# Patient Record
Sex: Male | Born: 1950 | Race: White | Hispanic: No | Smoking: Former smoker
Health system: Southern US, Community
[De-identification: ages and names within clinical notes are randomized; demographics above are authoritative.]

## PROBLEM LIST (undated history)

## (undated) DIAGNOSIS — I639 Cerebral infarction, unspecified: Secondary | ICD-10-CM

## (undated) DIAGNOSIS — R350 Frequency of micturition: Secondary | ICD-10-CM

## (undated) DIAGNOSIS — G629 Polyneuropathy, unspecified: Secondary | ICD-10-CM

## (undated) DIAGNOSIS — L0291 Cutaneous abscess, unspecified: Secondary | ICD-10-CM

## (undated) DIAGNOSIS — L97309 Non-pressure chronic ulcer of unspecified ankle with unspecified severity: Secondary | ICD-10-CM

## (undated) DIAGNOSIS — M549 Dorsalgia, unspecified: Secondary | ICD-10-CM

## (undated) DIAGNOSIS — L0390 Cellulitis, unspecified: Secondary | ICD-10-CM

## (undated) DIAGNOSIS — F329 Major depressive disorder, single episode, unspecified: Secondary | ICD-10-CM

## (undated) DIAGNOSIS — F3289 Other specified depressive episodes: Secondary | ICD-10-CM

## (undated) DIAGNOSIS — I10 Essential (primary) hypertension: Secondary | ICD-10-CM

## (undated) DIAGNOSIS — D126 Benign neoplasm of colon, unspecified: Secondary | ICD-10-CM

## (undated) DIAGNOSIS — T25319A Burn of third degree of unspecified ankle, initial encounter: Secondary | ICD-10-CM

## (undated) DIAGNOSIS — J309 Allergic rhinitis, unspecified: Secondary | ICD-10-CM

## (undated) HISTORY — DX: Non-pressure chronic ulcer of unspecified ankle with unspecified severity: L97.309

## (undated) HISTORY — PX: EYE SURGERY: SHX253

## (undated) HISTORY — DX: Allergic rhinitis, unspecified: J30.9

## (undated) HISTORY — DX: Major depressive disorder, single episode, unspecified: F32.9

## (undated) HISTORY — DX: Other specified depressive episodes: F32.89

## (undated) HISTORY — DX: Burn of third degree of unspecified ankle, initial encounter: T25.319A

## (undated) HISTORY — DX: Cellulitis, unspecified: L03.90

## (undated) HISTORY — DX: Frequency of micturition: R35.0

## (undated) HISTORY — DX: Polyneuropathy, unspecified: G62.9

## (undated) HISTORY — DX: Cutaneous abscess, unspecified: L02.91

## (undated) HISTORY — DX: Dorsalgia, unspecified: M54.9

## (undated) HISTORY — PX: TONSILLECTOMY: SUR1361

## (undated) HISTORY — DX: Benign neoplasm of colon, unspecified: D12.6

---

## 2010-12-08 LAB — IFOBT (OCCULT BLOOD): IFOBT: NEGATIVE

## 2010-12-27 ENCOUNTER — Telehealth: Payer: Self-pay

## 2010-12-28 ENCOUNTER — Other Ambulatory Visit: Payer: Self-pay

## 2010-12-28 DIAGNOSIS — Z139 Encounter for screening, unspecified: Secondary | ICD-10-CM

## 2010-12-28 NOTE — Telephone Encounter (Signed)
Gastroenterology Pre-Procedure Form  Request Date: 12/26/2010       Requesting Physician: Dr. Julaine Fusi     PATIENT INFORMATION:  Rick Welch is a 60 y.o., male (DOB=Jul 06, 1950).  PROCEDURE: Procedure(s) requested: colonoscopy Procedure Reason: screening for colon cancer  PATIENT REVIEW QUESTIONS: The patient reports the following:   1. Diabetes Melitis: Yes 2. Joint replacements in the past 12 months: no 3. Major health problems in the past 3 months: no 4. Has an artificial valve or MVP:no 5. Has been advised in past to take antibiotics in advance of a procedure like teeth cleaning: no}    MEDICATIONS & ALLERGIES:    Patient reports the following regarding taking any blood thinners:   Plavix? no Aspirin?no Coumadin?  no  Patient confirms/reports the following medications:  Current Outpatient Prescriptions  Medication Sig Dispense Refill  . glyBURIDE (DIABETA) 5 MG tablet Take 5 mg by mouth daily with breakfast.        . lisinopril (PRINIVIL,ZESTRIL) 40 MG tablet Take 40 mg by mouth daily.        . metFORMIN (GLUCOPHAGE) 1000 MG tablet Take 1,000 mg by mouth 2 (two) times daily.        . Multiple Vitamin (MULTIVITAMIN) tablet Take 1 tablet by mouth daily.        . NON FORMULARY Bisoprolol Fumarate HCT   5 mg per pt/ one tablet daily         Patient confirms/reports the following allergies:  Allergies  Allergen Reactions  . Iodine Other (See Comments)    THROAT SWELLING  . Penicillins Other (See Comments)    UNSURE/ WHEN PT WAS A CHILD    Patient is appropriate to schedule for requested procedure(s): yes  AUTHORIZATION INFORMATION Primary Insurance:   ID #:  Group #:  Pre-Cert / Auth required:  Pre-Cert / Auth #:   Secondary Insurance:   ID #:   Group #:  Pre-Cert / Auth required Pre-Cert / Auth #  No orders of the defined types were placed in this encounter.    SCHEDULE INFORMATION: Procedure has been scheduled as follows:  Date: 02/09/2011     8:30  AM Location: [facility] Short Stay  This Gastroenterology Pre-Precedure Form is being routed to the following provider(s) for review: Jonette Eva, MD

## 2011-01-08 NOTE — Telephone Encounter (Signed)
HOLD GLYBURIDE ON AM OF PROCEDURE.  MOVIPREP SPLIT DOSING

## 2011-01-15 NOTE — Telephone Encounter (Signed)
Rx and instructions mailed.  

## 2011-01-24 ENCOUNTER — Telehealth: Payer: Self-pay

## 2011-01-24 NOTE — Telephone Encounter (Signed)
Home number busy. [facility]  [phone] for a return call to update triage.

## 2011-01-29 NOTE — Telephone Encounter (Signed)
LMOM for a return call.  

## 2011-02-06 NOTE — Telephone Encounter (Signed)
LMOM both phone numbers for a return call.

## 2011-02-06 NOTE — Telephone Encounter (Signed)
Pt returned called. Updated info for his procedure. No new problems, just 2 new blood pressure meds added.

## 2011-02-07 NOTE — Telephone Encounter (Signed)
Called, busy signal. (Pt has instructions, just need to inform ok to have breakfast morning of prep)

## 2011-02-07 NOTE — Telephone Encounter (Signed)
HOLD GLYBURIDE ON AM OF PROCEDURE.  MOVI PREP SPLIT DOSING, REGULAR BREAKFAST. CLEAR LIQUIDS AFTER 9 AM.

## 2011-02-08 MED ORDER — SODIUM CHLORIDE 0.45 % IV SOLN
Freq: Once | INTRAVENOUS | Status: AC
  Administered 2011-02-09: 08:00:00 via INTRAVENOUS

## 2011-02-09 ENCOUNTER — Other Ambulatory Visit: Payer: Self-pay | Admitting: Gastroenterology

## 2011-02-09 ENCOUNTER — Encounter (HOSPITAL_COMMUNITY): Payer: Self-pay | Admitting: *Deleted

## 2011-02-09 ENCOUNTER — Encounter (HOSPITAL_COMMUNITY)
Admission: RE | Disposition: A | Discharge status: Home / Self Care | Payer: Self-pay | Source: Ambulatory Visit | Attending: Gastroenterology

## 2011-02-09 ENCOUNTER — Ambulatory Visit (HOSPITAL_COMMUNITY)
Admission: RE | Admit: 2011-02-09 | Discharge: 2011-02-09 | Disposition: A | Discharge status: Home / Self Care | Payer: 59 | Source: Ambulatory Visit | Attending: Gastroenterology | Admitting: Gastroenterology

## 2011-02-09 DIAGNOSIS — I10 Essential (primary) hypertension: Secondary | ICD-10-CM | POA: Insufficient documentation

## 2011-02-09 DIAGNOSIS — D126 Benign neoplasm of colon, unspecified: Secondary | ICD-10-CM

## 2011-02-09 DIAGNOSIS — Z01812 Encounter for preprocedural laboratory examination: Secondary | ICD-10-CM | POA: Insufficient documentation

## 2011-02-09 DIAGNOSIS — E119 Type 2 diabetes mellitus without complications: Secondary | ICD-10-CM | POA: Insufficient documentation

## 2011-02-09 DIAGNOSIS — Z139 Encounter for screening, unspecified: Secondary | ICD-10-CM

## 2011-02-09 DIAGNOSIS — Z1211 Encounter for screening for malignant neoplasm of colon: Secondary | ICD-10-CM | POA: Insufficient documentation

## 2011-02-09 DIAGNOSIS — Z79899 Other long term (current) drug therapy: Secondary | ICD-10-CM | POA: Insufficient documentation

## 2011-02-09 DIAGNOSIS — K648 Other hemorrhoids: Secondary | ICD-10-CM | POA: Insufficient documentation

## 2011-02-09 HISTORY — DX: Essential (primary) hypertension: I10

## 2011-02-09 HISTORY — PX: COLONOSCOPY: SHX5424

## 2011-02-09 LAB — GLUCOSE, CAPILLARY: Glucose-Capillary: 167 mg/dL — ABNORMAL HIGH (ref 70–99)

## 2011-02-09 SURGERY — COLONOSCOPY
Anesthesia: Moderate Sedation

## 2011-02-09 MED ORDER — STERILE WATER FOR IRRIGATION IR SOLN
Status: DC | PRN
  Administered 2011-02-09: 08:00:00

## 2011-02-09 MED ORDER — MIDAZOLAM HCL 5 MG/5ML IJ SOLN
INTRAMUSCULAR | Status: AC
  Filled 2011-02-09: qty 10

## 2011-02-09 MED ORDER — MEPERIDINE HCL 100 MG/ML IJ SOLN
INTRAMUSCULAR | Status: DC | PRN
  Administered 2011-02-09: 50 mg via INTRAVENOUS
  Administered 2011-02-09: 25 mg via INTRAVENOUS

## 2011-02-09 MED ORDER — MIDAZOLAM HCL 5 MG/5ML IJ SOLN
INTRAMUSCULAR | Status: DC | PRN
  Administered 2011-02-09: 1 mg via INTRAVENOUS
  Administered 2011-02-09: 2 mg via INTRAVENOUS

## 2011-02-09 MED ORDER — MEPERIDINE HCL 100 MG/ML IJ SOLN
INTRAMUSCULAR | Status: AC
  Filled 2011-02-09: qty 2

## 2011-02-09 MED ORDER — EPINEPHRINE HCL 0.1 MG/ML IJ SOLN
INTRAMUSCULAR | Status: AC
  Filled 2011-02-09: qty 20

## 2011-02-09 NOTE — H&P (Signed)
  Primary Care Physician:  Henri Medal, MD Primary Gastroenterologist:  Dr. Darrick Penna  Pre-Procedure History & Physical: HPI:  Rick Welch is a 60 y.o. male here for COLON CANCER SCREENING.  Past Medical History  Diagnosis Date  . Diabetes mellitus   . Hypertension   . Asthma     Past Surgical History  Procedure Date  . Tonsillectomy     Prior to Admission medications   Medication Sig Start Date End Date Taking? Authorizing Provider  amLODipine (NORVASC) 10 MG tablet Take 10 mg by mouth daily.     Yes Historical Provider, MD  bisoprolol-hydrochlorothiazide (ZIAC) 5-6.25 MG per tablet Take 1 tablet by mouth daily.     Yes Historical Provider, MD  glyBURIDE (DIABETA) 5 MG tablet Take 5 mg by mouth daily with breakfast.     Yes Historical Provider, MD  lisinopril (PRINIVIL,ZESTRIL) 40 MG tablet Take 40 mg by mouth daily.     Yes Historical Provider, MD  metFORMIN (GLUCOPHAGE) 1000 MG tablet Take 1,000 mg by mouth 2 (two) times daily.     Yes Historical Provider, MD  Multiple Vitamin (MULTIVITAMIN) tablet Take 1 tablet by mouth daily.     Yes Historical Provider, MD  NON FORMULARY Bisoprolol Fumarate HCT   5 mg per pt/ one tablet daily     Historical Provider, MD    Allergies as of 12/28/2010 - Review Complete 12/28/2010  Allergen Reaction Noted  . Iodine Other (See Comments) 12/28/2010  . Penicillins Other (See Comments) 12/28/2010    Family History  Problem Relation Age of Onset  . Colon cancer Neg Hx   NO POLYPS  History   Social History  . Marital Status: Divorced    Spouse Name: N/A    Number of Children: N/A  . Years of Education: N/A   Occupational History  . Not on file.   Social History Main Topics  . Smoking status: Current Everyday Smoker -- 0.2 packs/day for 20 years  . Smokeless tobacco: Not on file  . Alcohol Use: Not on file  . Drug Use: No  . Sexually Active:    Other Topics Concern  . Not on file   Social History Narrative  . No  narrative on file    Review of Systems: See HPI, otherwise negative ROS   Physical Exam: BP 160/82  Pulse 71  Temp(Src) 98.2 F (36.8 C) (Oral)  Resp 20  Ht 5\' 9"  (1.753 m)  Wt 210 lb (95.255 kg)  BMI 31.01 kg/m2  SpO2 95% General:   Alert,  pleasant and cooperative in NAD Head:  Normocephalic and atraumatic. Neck:  Supple Lungs:  Clear throughout to auscultation.    Heart:  Regular rate and rhythm. Abdomen:  Soft, nontender and nondistended. Normal bowel sounds, without guarding, and without rebound.   Neurologic:  Alert and  oriented x4;  grossly normal neurologically.  Impression/Plan:    Average risk  PLAN:  TCS TODAY

## 2011-02-09 NOTE — Progress Notes (Signed)
Pt's IV has already been charted as taken out at 0953 by someone else. I was the nurse that actually removed the 22g in the right lateral wrist before pt's discharge home after colonoscopy at 1000, pt's IV tip was intact upon removing.

## 2011-02-16 ENCOUNTER — Telehealth: Payer: Self-pay | Admitting: Gastroenterology

## 2011-02-16 NOTE — Telephone Encounter (Signed)
LMOM to call back

## 2011-02-16 NOTE — Telephone Encounter (Signed)
Please call pt. She had AN ADVANCED adenomas removed from her colon. TCS in 3 years. High fiber diet. ALL FIRST DEGREE RELATIVES NEED A TCS AT AGE 62 AND THEN Q5 YEARS.

## 2011-02-16 NOTE — Telephone Encounter (Signed)
Results Cc to PCP  

## 2011-02-19 ENCOUNTER — Encounter (HOSPITAL_COMMUNITY): Payer: Self-pay | Admitting: Gastroenterology

## 2011-02-19 NOTE — Telephone Encounter (Signed)
Pt called back and is aware of results with now question.

## 2011-02-22 NOTE — Telephone Encounter (Signed)
tcs in 3 years

## 2011-09-19 DIAGNOSIS — L97309 Non-pressure chronic ulcer of unspecified ankle with unspecified severity: Secondary | ICD-10-CM

## 2011-09-19 HISTORY — DX: Non-pressure chronic ulcer of unspecified ankle with unspecified severity: L97.309

## 2011-09-24 ENCOUNTER — Encounter: Payer: Self-pay | Admitting: Vascular Surgery

## 2011-09-25 ENCOUNTER — Encounter: Payer: Self-pay | Admitting: Vascular Surgery

## 2011-09-25 ENCOUNTER — Ambulatory Visit (INDEPENDENT_AMBULATORY_CARE_PROVIDER_SITE_OTHER): Payer: 59 | Admitting: Vascular Surgery

## 2011-09-25 VITALS — BP 149/77 | HR 71 | Temp 98.8°F | Ht 69.0 in | Wt 203.0 lb

## 2011-09-25 DIAGNOSIS — I739 Peripheral vascular disease, unspecified: Secondary | ICD-10-CM

## 2011-09-25 DIAGNOSIS — L98499 Non-pressure chronic ulcer of skin of other sites with unspecified severity: Secondary | ICD-10-CM | POA: Insufficient documentation

## 2011-09-25 DIAGNOSIS — I7025 Atherosclerosis of native arteries of other extremities with ulceration: Secondary | ICD-10-CM | POA: Insufficient documentation
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
## 2013-02-02 ENCOUNTER — Ambulatory Visit: Payer: 59 | Admitting: Cardiology

## 2013-02-02 ENCOUNTER — Ambulatory Visit (INDEPENDENT_AMBULATORY_CARE_PROVIDER_SITE_OTHER): Payer: 59 | Admitting: Cardiology

## 2013-02-02 ENCOUNTER — Encounter: Payer: Self-pay | Admitting: Cardiology

## 2013-02-02 VITALS — BP 153/82 | HR 59 | Ht 69.0 in | Wt 211.0 lb

## 2013-02-02 DIAGNOSIS — R609 Edema, unspecified: Secondary | ICD-10-CM

## 2013-02-02 DIAGNOSIS — R0609 Other forms of dyspnea: Secondary | ICD-10-CM

## 2013-02-02 DIAGNOSIS — E119 Type 2 diabetes mellitus without complications: Secondary | ICD-10-CM | POA: Insufficient documentation

## 2013-02-02 DIAGNOSIS — R0600 Dyspnea, unspecified: Secondary | ICD-10-CM

## 2013-02-02 DIAGNOSIS — I10 Essential (primary) hypertension: Secondary | ICD-10-CM | POA: Insufficient documentation

## 2013-02-02 DIAGNOSIS — R600 Localized edema: Secondary | ICD-10-CM

## 2013-02-02 MED ORDER — HYDROCHLOROTHIAZIDE 25 MG PO TABS: 25.0000 mg | ORAL_TABLET | Freq: Every day | ORAL | Status: DC

## 2013-02-02 NOTE — Patient Instructions (Addendum)
Your physician recommends that you schedule a follow-up appointment in: 3-4 weeks with Dr. Wyline Mood. This appointment will be scheduled today before you leave.  Your physician has recommended you make the following change in your medication:  Stop: Bisoprolol - Hydrochlorothiazide Start: Hydrochlorothiazide 25 MG once daily  Continue all other medications the same.   Your physician has requested that you have an echocardiogram. Echocardiography is a painless test that uses sound waves to create images of your heart. It provides your doctor with information about the size and shape of your heart and how well your heart's chambers and valves are working. This procedure takes approximately one hour. There are no restrictions for this procedure.
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
## 2013-02-19 ENCOUNTER — Other Ambulatory Visit (INDEPENDENT_AMBULATORY_CARE_PROVIDER_SITE_OTHER): Payer: 59

## 2013-02-19 ENCOUNTER — Other Ambulatory Visit: Payer: Self-pay

## 2013-02-19 DIAGNOSIS — R0600 Dyspnea, unspecified: Secondary | ICD-10-CM

## 2013-02-19 DIAGNOSIS — R609 Edema, unspecified: Secondary | ICD-10-CM

## 2013-02-19 DIAGNOSIS — R0609 Other forms of dyspnea: Secondary | ICD-10-CM

## 2013-02-24 ENCOUNTER — Telehealth: Payer: Self-pay | Admitting: Cardiology

## 2013-02-24 ENCOUNTER — Ambulatory Visit (INDEPENDENT_AMBULATORY_CARE_PROVIDER_SITE_OTHER): Payer: 59 | Admitting: Cardiology

## 2013-02-24 ENCOUNTER — Encounter: Payer: Self-pay | Admitting: Cardiology

## 2013-02-24 VITALS — BP 160/89 | HR 73 | Ht 69.0 in | Wt 201.0 lb

## 2013-02-24 DIAGNOSIS — R600 Localized edema: Secondary | ICD-10-CM

## 2013-02-24 DIAGNOSIS — R609 Edema, unspecified: Secondary | ICD-10-CM

## 2013-02-24 DIAGNOSIS — I519 Heart disease, unspecified: Secondary | ICD-10-CM

## 2013-02-24 DIAGNOSIS — I10 Essential (primary) hypertension: Secondary | ICD-10-CM

## 2013-02-24 DIAGNOSIS — I5189 Other ill-defined heart diseases: Secondary | ICD-10-CM | POA: Insufficient documentation

## 2013-02-24 NOTE — Patient Instructions (Signed)
Your physician recommends that you schedule a follow-up appointment in: 6 months with Dr. Wyline Mood. You should receive a letter in the mail in 4 months. If you do not receive this letter by April 2015 call our office to schedule this appointment.   Your physician has recommended you make the following change in your medication:  Start: Aspirin 81 MG once daily.  Your physician wants you to keep a blood pressure log.  Your physician wants you to bring your blood pressure cuff with you to your next office visit.
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
## 2013-02-24 NOTE — Telephone Encounter (Signed)
Opened in error

## 2013-02-24 NOTE — Telephone Encounter (Signed)
Message copied by Burnice Logan on Tue Feb 24, 2013  3:39 PM ------      Message from: Dina Rich F      Created: Tue Feb 24, 2013  8:54 AM       Please let patient know that his echo shows normal pumping function. His heart is mildly stiff, which often can happen as we get older or if there is a history of hypertension. We will discuss everything in detail when I see him next.                   Dina Rich ------

## 2013-02-24 NOTE — Telephone Encounter (Signed)
Pt.notified

## 2013-02-24 NOTE — Telephone Encounter (Signed)
Message copied by Burnice Logan on Tue Feb 24, 2013  5:15 PM ------      Message from: Dina Rich F      Created: Tue Feb 24, 2013  8:54 AM       Please let patient know that his echo shows normal pumping function. His heart is mildly stiff, which often can happen as we get older or if there is a history of hypertension. We will discuss everything in detail when I see him next.                   Dina Rich ------

## 2013-02-24 NOTE — Telephone Encounter (Signed)
Called pt and line was busy will try again later.

## 2013-09-08 ENCOUNTER — Encounter: Payer: Self-pay | Admitting: Cardiology

## 2013-09-08 ENCOUNTER — Ambulatory Visit (INDEPENDENT_AMBULATORY_CARE_PROVIDER_SITE_OTHER): Payer: Medicare Other | Admitting: Cardiology

## 2013-09-08 VITALS — BP 148/83 | HR 75 | Ht 69.0 in | Wt 204.0 lb

## 2013-09-08 DIAGNOSIS — R609 Edema, unspecified: Secondary | ICD-10-CM

## 2013-09-08 DIAGNOSIS — I5189 Other ill-defined heart diseases: Secondary | ICD-10-CM

## 2013-09-08 DIAGNOSIS — I10 Essential (primary) hypertension: Secondary | ICD-10-CM

## 2013-09-08 DIAGNOSIS — I519 Heart disease, unspecified: Secondary | ICD-10-CM

## 2013-09-08 DIAGNOSIS — R600 Localized edema: Secondary | ICD-10-CM

## 2013-09-08 NOTE — Patient Instructions (Signed)
Continue all current medications. DASH & low fat diet info provided today Your physician wants you to follow up in:  1 year.  You will receive a reminder letter in the mail one-two months in advance.  If you don't receive a letter, please call our office to schedule the follow up appointment
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
## 2013-12-30 ENCOUNTER — Other Ambulatory Visit: Payer: Self-pay | Admitting: *Deleted

## 2013-12-30 ENCOUNTER — Telehealth: Payer: Self-pay | Admitting: *Deleted

## 2013-12-30 MED ORDER — HYDROCHLOROTHIAZIDE 25 MG PO TABS: 25.0000 mg | ORAL_TABLET | Freq: Every day | ORAL | Status: DC

## 2013-12-30 NOTE — Telephone Encounter (Signed)
Someone else took care of this.

## 2014-01-20 ENCOUNTER — Encounter: Payer: Self-pay | Admitting: Gastroenterology

## 2015-06-23 ENCOUNTER — Encounter: Payer: Self-pay | Admitting: Cardiology

## 2015-06-23 LAB — HM DIABETES EYE EXAM

## 2015-10-31 ENCOUNTER — Encounter: Payer: Self-pay | Admitting: Cardiology

## 2016-12-07 ENCOUNTER — Encounter: Payer: Self-pay | Admitting: *Deleted

## 2016-12-10 ENCOUNTER — Encounter: Payer: Self-pay | Admitting: Cardiology

## 2016-12-10 ENCOUNTER — Ambulatory Visit (INDEPENDENT_AMBULATORY_CARE_PROVIDER_SITE_OTHER): Payer: Medicare Other | Admitting: Cardiology

## 2016-12-10 VITALS — BP 132/72 | HR 69 | Ht 69.0 in | Wt 167.0 lb

## 2016-12-10 DIAGNOSIS — I10 Essential (primary) hypertension: Secondary | ICD-10-CM

## 2016-12-10 DIAGNOSIS — E782 Mixed hyperlipidemia: Secondary | ICD-10-CM | POA: Diagnosis not present

## 2016-12-10 DIAGNOSIS — I5032 Chronic diastolic (congestive) heart failure: Secondary | ICD-10-CM

## 2016-12-10 DIAGNOSIS — I639 Cerebral infarction, unspecified: Secondary | ICD-10-CM | POA: Diagnosis not present

## 2016-12-10 NOTE — Patient Instructions (Signed)
Your physician recommends that you schedule a follow-up appointment in: [place]  Your physician recommends that you continue on your current medications as directed. Please refer to the Current Medication list given to you today.  Your physician recommends that you return for lab work BMP/MG  Your physician has recommended that you wear an event monitor FOR 30 DAYS. Event monitors are medical devices that record the heart's electrical activity. Doctors most often [place] these monitors to diagnose arrhythmias. Arrhythmias are problems with the speed or rhythm of the heartbeat. The monitor is a small, portable device. You can wear one while you do your normal daily activities. This is usually used to diagnose what is causing palpitations/syncope (passing out).  Thank you for choosing [facility]!!
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
## 2016-12-13 ENCOUNTER — Encounter (INDEPENDENT_AMBULATORY_CARE_PROVIDER_SITE_OTHER): Payer: Medicare Other

## 2016-12-13 DIAGNOSIS — I639 Cerebral infarction, unspecified: Secondary | ICD-10-CM

## 2016-12-19 ENCOUNTER — Ambulatory Visit (INDEPENDENT_AMBULATORY_CARE_PROVIDER_SITE_OTHER): Payer: Medicare Other | Admitting: Internal Medicine

## 2016-12-19 ENCOUNTER — Ambulatory Visit (INDEPENDENT_AMBULATORY_CARE_PROVIDER_SITE_OTHER)
Admission: RE | Admit: 2016-12-19 | Discharge: 2016-12-19 | Disposition: A | Discharge status: Home / Self Care | Payer: Medicare Other | Source: Ambulatory Visit | Attending: Internal Medicine | Admitting: Internal Medicine

## 2016-12-19 ENCOUNTER — Institutional Professional Consult (permissible substitution): Payer: BLUE CROSS/BLUE SHIELD | Admitting: Emergency Medicine

## 2016-12-19 ENCOUNTER — Encounter: Payer: Self-pay | Admitting: Internal Medicine

## 2016-12-19 VITALS — BP 130/70 | HR 70 | Ht 69.0 in | Wt 180.0 lb

## 2016-12-19 DIAGNOSIS — R9389 Abnormal findings on diagnostic imaging of other specified body structures: Secondary | ICD-10-CM | POA: Diagnosis not present

## 2016-12-19 DIAGNOSIS — I639 Cerebral infarction, unspecified: Secondary | ICD-10-CM | POA: Diagnosis not present

## 2016-12-19 NOTE — Patient Instructions (Signed)
Please remember to go to the  x-ray department downstairs in the basement  for your tests - we will call you with the results when they are available.      Please schedule a follow up office visit in 4 weeks, sooner if needed with pfts on return

## 2016-12-19 NOTE — Progress Notes (Signed)
Subjective:     Patient ID: Rick Welch, male   DOB: [birth year],    MRN: [id number]  HPI   79 yowm  Quit smokng 2004 and doing ok was living on his own but 3 strokes since April 2018 and after the third in July 2018 at [facility] in [place]  With ? Lung mass referred to pulmonary clinic 12/19/2016 by Dr   Glori Luis   12/19/2016 1st [facility] office visit/ Rick Welch   Chief Complaint  Patient presents with  . pulmonary consult    Pt was referred for spots on his lung, pt is not sure who referred him.   pt is wheelchair bound and denies sob at rest or noct/ no cough but very weak and unsteady on feet since last cva and maint on eliquis  No obvious day to day or daytime variability in symptoms or assoc excess/ purulent sputum or mucus plugs or hemoptysis or cp or chest tightness, subjective wheeze or overt sinus or hb symptoms. No unusual exp hx or h/o childhood pna/ asthma or knowledge of premature birth.  Sleeping ok flat without nocturnal  or early am exacerbation  of respiratory  c/o's or need for noct saba. Also denies any obvious fluctuation of symptoms with weather or environmental changes or other aggravating or alleviating factors except as outlined above   Current Allergies, Complete Past Medical History, Past Surgical History, Family History, and Social History were reviewed in [employer] record.  ROS  The following are not active complaints unless bolded Hoarseness, sore throat, dysphagia, dental problems, itching, sneezing,  nasal congestion or discharge of excess mucus or purulent secretions, ear ache,   fever, chills, sweats, unintended wt loss or wt gain, classically pleuritic or exertional cp,  orthopnea pnd or leg swelling, presyncope, palpitations, abdominal pain, anorexia, nausea, vomiting, diarrhea  or change in bowel habits or change in bladder habits, change in stools or change in urine, dysuria, hematuria,  rash, arthralgias,  visual complaints, headache, numbness, weakness or ataxia or problems with walking or coordination,  change in mood/affect or memory.        Current Meds  Medication Sig  . acetaminophen (TYLENOL) 650 MG CR tablet Take 650 mg by mouth every 8 (eight) hours as needed for pain.  Marland Kitchen apixaban (ELIQUIS) 2.5 MG TABS tablet Take 2.5 mg by mouth 2 (two) times daily.  Marland Kitchen aspirin 81 MG tablet Take 81 mg by mouth daily.  Marland Kitchen atorvastatin (LIPITOR) 80 MG tablet Take 80 mg by mouth daily.  . calcium carbonate (TUMS - DOSED IN MG ELEMENTAL CALCIUM) 500 MG chewable tablet Chew 1 tablet by mouth 2 (two) times daily.  . Cholecalciferol (VITAMIN D3) 1000 units CAPS Take 1 capsule by mouth daily.  . insulin lispro (HUMALOG) 100 UNIT/ML injection Inject into the skin. Sliding scale  . ipratropium-albuterol (DUONEB) 0.5-2.5 (3) MG/3ML SOLN Take 3 mLs by nebulization every 4 (four) hours as needed.  . Magnesium Hydroxide (MILK OF MAGNESIA PO) Take by mouth as needed.  . Multiple Vitamin (MULTIVITAMIN) tablet Take 1 tablet by mouth daily.  . Nutritional Supplements (PROMOD) LIQD Take by mouth daily.  . Nutritional Supplements (RA MELATONIN/B-6 PO) Take 1 tablet by mouth at bedtime.  . polyethylene glycol (MIRALAX / GLYCOLAX) packet Take 17 g by mouth daily.  . potassium chloride (KLOR-CON) 8 MEQ tablet Take 8 mEq by mouth daily.  Marland Kitchen senna (SENOKOT) 8.6 MG tablet Take 2 tablets by mouth 2 (two) times  daily.  . sertraline (ZOLOFT) 100 MG tablet Take 100 mg by mouth daily.  Marland Kitchen torsemide (DEMADEX) 20 MG tablet Take 20 mg by mouth daily.  . vitamin C (ASCORBIC ACID) 500 MG tablet Take 500 mg by mouth daily.          Review of Systems     Objective:   Physical Exam    chronically ill w/c bound extremely weak cough effort  Wt Readings from Last 3 Encounters:  12/19/16 180 lb (81.6 kg)  12/10/16 167 lb (75.8 kg)  09/08/13 204 lb (92.5 kg)    Vital signs reviewed  - Note on arrival 02 sats  97% on RA      HEENT: nl  turbinates bilaterally, and oropharynx. Nl external ear canals without cough reflex - edentulous    NECK :  without JVD/Nodes/TM/ nl carotid upstrokes bilaterally   LUNGS: no acc muscle use,  Nl contour chest which is clear to A and P bilaterally without cough on insp or exp maneuvers   CV:  RRR  no s3 or murmur or increase in P2, and no edema   ABD:  soft and nontender with nl inspiratory excursion . No bruits or organomegaly appreciated, bowel sounds nl  MS:   ext warm without deformities, calf tenderness, cyanosis or clubbing No obvious joint restrictions   SKIN: warm and dry without lesions    NEURO:  alert, confused  "I live at home" with difficult /slow speech /mentation     CT chest w/o contrast 11/06/16  ? rul nodule with vol loss    CXR PA and Lateral:   12/19/2016 :    I personally reviewed images and   impression as follows:   No def mass or vol loss     Assessment:

## 2016-12-20 ENCOUNTER — Encounter: Payer: Self-pay | Admitting: Internal Medicine

## 2016-12-20 NOTE — Progress Notes (Signed)
ATC, NA and no option to leave a msg, WCB 

## 2016-12-20 NOTE — Assessment & Plan Note (Addendum)
[facility] 11/06/16 ? Post obst vol loss RUL/ RUL nodule - CXR 12/19/2016 no mass or vol loss  - Spirometry 12/19/2016  FEV1 1.64 (51%)  Ratio 79    There is no obvious mass or vol loss on today's film and the pt is no shape at present for any form of aggressive rx - appears very debilitated, very  weak cough effort and clearly not a candidate for surgery.   Since there were also other abnormalities seen on CT chest that could represent occult ca rec he undergo PET when he returns in 4 weeks and then consider option of bx of most accessible location though even that is going to require stopping eliquis and may risk recurrent cva/ his main medical condition that has landed him apparently in permanent snf status.   Total time devoted to counseling  > 50 % of initial 60 min office visit:  review case with pt/wife discussion of options/alternatives/ personally creating written customized instructions  in presence of pt  then going over those specific  Instructions directly with the pt including how to use all of the meds but in particular covering each new medication in detail and the difference between the maintenance= "automatic" meds and the prns using an action plan format for the latter (If this problem/symptom => do that organization reading Left to right).  Please see AVS from this visit for a full list of these instructions which I personally wrote for this pt and  are unique to this visit.

## 2016-12-27 ENCOUNTER — Institutional Professional Consult (permissible substitution): Payer: BLUE CROSS/BLUE SHIELD | Admitting: Emergency Medicine

## 2017-01-16 ENCOUNTER — Telehealth: Payer: Self-pay | Admitting: Internal Medicine

## 2017-01-16 NOTE — Telephone Encounter (Signed)
ATC pt, no answer. Left message for pt to call back.  Need to clarify message.

## 2017-01-18 ENCOUNTER — Telehealth: Payer: Self-pay | Admitting: Emergency Medicine

## 2017-01-18 NOTE — Telephone Encounter (Signed)
Malachy Mood called back and she stated that Dr. Dema Severin will be signing these orders.

## 2017-01-18 NOTE — Telephone Encounter (Signed)
RB please advise if you are ok with ordering the speech therapy now that the pt is at home.  Please advise. Thanks

## 2017-01-18 NOTE — Telephone Encounter (Signed)
Called and spoke with Malachy Mood from [facility] and she stated that the pt was discharged from rehab and that he will be coming in today for PT.  She stated that they need to make sure that MW will sign these orders for PT/OT/nursing visits.  MW please advise. Thanks

## 2017-01-21 ENCOUNTER — Telehealth: Payer: Self-pay | Admitting: Internal Medicine

## 2017-01-21 NOTE — Telephone Encounter (Signed)
MW are you ok to give verbal on pt receiving PT 2 times per week for the next 3 weeks. Please advise.

## 2017-01-21 NOTE — Telephone Encounter (Signed)
Fine with me

## 2017-01-22 ENCOUNTER — Telehealth: Payer: Self-pay | Admitting: Internal Medicine

## 2017-01-22 NOTE — Telephone Encounter (Signed)
Spoke with Angie and she states he has not had a bowel movement in 8 days. He is very nauseous and she is requesting something for nausea. He has a poor appetite and she is concerned about 8 days without bowel movement. MW please advise.

## 2017-01-22 NOTE — Telephone Encounter (Signed)
Advised Judy orders were ok'd by RB. Nothing further is needed.

## 2017-01-22 NOTE — Telephone Encounter (Signed)
Advised Angie to call patients PCP. She understood and nothing further is needed.

## 2017-01-22 NOTE — Telephone Encounter (Signed)
This is not related at all to his pulmonary condition and should be addressed by his pcp

## 2017-01-22 NOTE — Telephone Encounter (Signed)
Yes ok to order  

## 2017-01-22 NOTE — Telephone Encounter (Signed)
Spoke with Marzetta Board and gave verbal order for PT. Nothing further is needed.

## 2017-02-05 ENCOUNTER — Ambulatory Visit: Payer: BLUE CROSS/BLUE SHIELD | Admitting: Internal Medicine

## 2017-02-14 ENCOUNTER — Encounter: Payer: Self-pay | Admitting: *Deleted

## 2017-02-14 ENCOUNTER — Telehealth: Payer: Self-pay | Admitting: *Deleted

## 2017-02-14 NOTE — Telephone Encounter (Signed)
Mailbox full - will send normal results through MyChart - routed to pcp

## 2017-02-14 NOTE — Telephone Encounter (Signed)
-----   Message from Arnoldo Lenis, MD sent at 02/07/2017 10:00 AM EST ----- Normal heart monitor  Zandra Abts MD

## 2017-02-15 ENCOUNTER — Encounter (HOSPITAL_COMMUNITY): Payer: Self-pay | Admitting: Emergency Medicine

## 2017-02-15 ENCOUNTER — Emergency Department (HOSPITAL_COMMUNITY): Payer: Medicare Other

## 2017-02-15 ENCOUNTER — Emergency Department (HOSPITAL_COMMUNITY)
Admission: EM | Admit: 2017-02-15 | Discharge: 2017-02-15 | Disposition: A | Discharge status: Home / Self Care | Payer: Medicare Other | Attending: Emergency Medicine | Admitting: Emergency Medicine

## 2017-02-15 DIAGNOSIS — R4701 Aphasia: Secondary | ICD-10-CM | POA: Insufficient documentation

## 2017-02-15 DIAGNOSIS — E1140 Type 2 diabetes mellitus with diabetic neuropathy, unspecified: Secondary | ICD-10-CM | POA: Diagnosis not present

## 2017-02-15 DIAGNOSIS — E860 Dehydration: Secondary | ICD-10-CM

## 2017-02-15 DIAGNOSIS — I10 Essential (primary) hypertension: Secondary | ICD-10-CM | POA: Diagnosis not present

## 2017-02-15 DIAGNOSIS — I110 Hypertensive heart disease with heart failure: Secondary | ICD-10-CM | POA: Insufficient documentation

## 2017-02-15 DIAGNOSIS — Z7901 Long term (current) use of anticoagulants: Secondary | ICD-10-CM | POA: Diagnosis not present

## 2017-02-15 DIAGNOSIS — Z79899 Other long term (current) drug therapy: Secondary | ICD-10-CM | POA: Diagnosis not present

## 2017-02-15 DIAGNOSIS — I69951 Hemiplegia and hemiparesis following unspecified cerebrovascular disease affecting right dominant side: Secondary | ICD-10-CM | POA: Diagnosis not present

## 2017-02-15 DIAGNOSIS — I5030 Unspecified diastolic (congestive) heart failure: Secondary | ICD-10-CM | POA: Insufficient documentation

## 2017-02-15 DIAGNOSIS — Z87891 Personal history of nicotine dependence: Secondary | ICD-10-CM | POA: Diagnosis not present

## 2017-02-15 DIAGNOSIS — E119 Type 2 diabetes mellitus without complications: Secondary | ICD-10-CM | POA: Insufficient documentation

## 2017-02-15 DIAGNOSIS — I69322 Dysarthria following cerebral infarction: Secondary | ICD-10-CM | POA: Insufficient documentation

## 2017-02-15 DIAGNOSIS — J45909 Unspecified asthma, uncomplicated: Secondary | ICD-10-CM | POA: Diagnosis not present

## 2017-02-15 DIAGNOSIS — Z794 Long term (current) use of insulin: Secondary | ICD-10-CM | POA: Insufficient documentation

## 2017-02-15 HISTORY — DX: Cerebral infarction, unspecified: I63.9

## 2017-02-15 LAB — COMPREHENSIVE METABOLIC PANEL
ALK PHOS: 102 U/L (ref 38–126)
ALT: 13 U/L — ABNORMAL LOW (ref 17–63)
ANION GAP: 6 (ref 5–15)
AST: 17 U/L (ref 15–41)
Albumin: 3.5 g/dL (ref 3.5–5.0)
BILIRUBIN TOTAL: 0.4 mg/dL (ref 0.3–1.2)
BUN: 18 mg/dL (ref 6–20)
CALCIUM: 8.7 mg/dL — AB (ref 8.9–10.3)
CO2: 24 mmol/L (ref 22–32)
Chloride: 112 mmol/L — ABNORMAL HIGH (ref 101–111)
Creatinine, Ser: 1.35 mg/dL — ABNORMAL HIGH (ref 0.61–1.24)
GFR calc Af Amer: >60 mL/min (ref >60)
GFR, EST NON AFRICAN AMERICAN: 52 mL/min — AB (ref >60)
GLUCOSE: 117 mg/dL — AB (ref 65–99)
Potassium: 4 mmol/L (ref 3.5–5.1)
Sodium: 142 mmol/L (ref 135–145)
TOTAL PROTEIN: 6.6 g/dL (ref 6.5–8.1)

## 2017-02-15 LAB — URINALYSIS, ROUTINE W REFLEX MICROSCOPIC
Bilirubin Urine: NEGATIVE
Glucose, UA: >=500 mg/dL — AB
Ketones, ur: NEGATIVE mg/dL
Nitrite: NEGATIVE
Protein, ur: >=300 mg/dL — AB
SQUAMOUS EPITHELIAL / LPF: NONE SEEN
Specific Gravity, Urine: 1.016 (ref 1.005–1.030)
pH: 7 (ref 5.0–8.0)

## 2017-02-15 LAB — CBC WITH DIFFERENTIAL/PLATELET
Basophils Absolute: 0 10*3/uL (ref 0.0–0.1)
Basophils Relative: 0 %
Eosinophils Absolute: 0.1 10*3/uL (ref 0.0–0.7)
Eosinophils Relative: 1 %
HEMATOCRIT: 36.4 % — AB (ref 39.0–52.0)
HEMOGLOBIN: 11.5 g/dL — AB (ref 13.0–17.0)
LYMPHS ABS: 1.9 10*3/uL (ref 0.7–4.0)
LYMPHS PCT: 20 %
MCH: 29.2 pg (ref 26.0–34.0)
MCHC: 31.6 g/dL (ref 30.0–36.0)
MCV: 92.4 fL (ref 78.0–100.0)
MONOS PCT: 8 %
Monocytes Absolute: 0.7 10*3/uL (ref 0.1–1.0)
NEUTROS ABS: 6.6 10*3/uL (ref 1.7–7.7)
NEUTROS PCT: 71 %
Platelets: 355 10*3/uL (ref 150–400)
RBC: 3.94 MIL/uL — ABNORMAL LOW (ref 4.22–5.81)
RDW: 15.6 % — ABNORMAL HIGH (ref 11.5–15.5)
WBC: 9.3 10*3/uL (ref 4.0–10.5)

## 2017-02-15 LAB — ETHANOL: Alcohol, Ethyl (B): <10 mg/dL (ref <10)

## 2017-02-15 LAB — CBG MONITORING, ED: Glucose-Capillary: 117 mg/dL — ABNORMAL HIGH (ref 65–99)

## 2017-02-15 MED ORDER — SODIUM CHLORIDE 0.9 % IV BOLUS (SEPSIS)
1000.0000 mL | Freq: Once | INTRAVENOUS | Status: AC
  Administered 2017-02-15: 1000 mL via INTRAVENOUS

## 2017-02-15 NOTE — ED Notes (Signed)
Pt posted for ED discharge.  Pt AVS reviewed with verbalized understandings.  Pt with two male family members to beside requesting EMS transport home.  Transport aranged.  PIV removed with hemostasis obtained.

## 2017-02-15 NOTE — ED Triage Notes (Signed)
Pt reports he had a stroke in April with right sided deficit.  Began having worsening slurred speech this morning upon waking at 1000.  Bruising noted to right side of face.  Pt states he fell in April at rehab causing this.
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
## 2017-02-15 NOTE — Discharge Instructions (Signed)
Please drink plenty of fluids. You work-up today does show mild dehydration.  Return without fail for worsening symptoms, including fever, confusion, changes in speech, or any other symptoms concerning to you.

## 2017-02-15 NOTE — ED Notes (Signed)
Pt had small BM.  Cleaning pt up.  Pt scrotal and perianal area very red in color and raw.  Pt states it is very painful.  Pt states that the home health nurses are not taking care of him and changing him.    States that he uses [facility].  States he has 2 nurses.  He states one does a good job and the other nurse does nothing for him.

## 2017-02-15 NOTE — ED Notes (Signed)
Spoke with DSS and advised on pt's skin on sacral and scrotal area.  ANd pt's comments about the [facility] not taking care of him.

## 2017-02-16 LAB — URINE CULTURE

## 2017-02-26 ENCOUNTER — Telehealth: Payer: Self-pay | Admitting: Internal Medicine

## 2017-02-26 NOTE — Telephone Encounter (Signed)
Spoke with Angie, she states RB sent a order for the catheter but it seems he may have saw pt in the hospital. He has seen MW since so I am not sure who to send this to. Please advise.

## 2017-02-27 ENCOUNTER — Telehealth: Payer: Self-pay | Admitting: Internal Medicine

## 2017-02-27 NOTE — Telephone Encounter (Signed)
A message was sent to RB about this because she said RB placed the order and I didn't think it should have gone to MW. See previous message. We will await response from [place] but do understand this is not a pulmonary issue. FYI RB

## 2017-02-27 NOTE — Telephone Encounter (Signed)
This is not a pulmonary issue and I don't see where we involved in it at any level reviewing th epic notes so needs to be addressed by present pcp or urology if they are following the pt

## 2017-02-27 NOTE — Telephone Encounter (Signed)
Spoke with Angie with [place]  She states needing order to have foley cath removed  She states RB is the one who placed the order but it was "grouped in" with orders by MW also  MW- please advise thanks

## 2017-03-01 NOTE — Telephone Encounter (Signed)
Attempted to contact Angie at [facility]. She didn't answer and her voicemail was full. Will try back.

## 2017-03-01 NOTE — Telephone Encounter (Signed)
I reviewed all the notes.  I do not know why his Foley catheter is ordered under my name because I have never seen the patient.  I cannot determine whether he needs it or does not at this point.  He needs to see his primary physician, review the pros and cons of having the catheter in place with that physician.  And then have the order made to discontinue the catheter by them if appropriate.

## 2017-03-04 NOTE — Telephone Encounter (Signed)
Called and spoke with Angie who stated she called pt's PCP and got the order for the foley catheter to be removed. Nothing further needed.

## 2017-03-15 ENCOUNTER — Emergency Department (HOSPITAL_COMMUNITY): Payer: Medicare Other

## 2017-03-15 ENCOUNTER — Inpatient Hospital Stay (HOSPITAL_COMMUNITY)
Admission: EM | Admit: 2017-03-15 | Discharge: 2017-03-19 | DRG: 391 | Disposition: A | Discharge status: Home Health | Payer: Medicare Other | Attending: Family Medicine | Admitting: Family Medicine

## 2017-03-15 ENCOUNTER — Other Ambulatory Visit: Payer: Self-pay

## 2017-03-15 ENCOUNTER — Encounter (HOSPITAL_COMMUNITY): Payer: Self-pay | Admitting: Emergency Medicine

## 2017-03-15 DIAGNOSIS — Z794 Long term (current) use of insulin: Secondary | ICD-10-CM | POA: Diagnosis not present

## 2017-03-15 DIAGNOSIS — R1310 Dysphagia, unspecified: Secondary | ICD-10-CM | POA: Diagnosis not present

## 2017-03-15 DIAGNOSIS — D62 Acute posthemorrhagic anemia: Secondary | ICD-10-CM | POA: Diagnosis not present

## 2017-03-15 DIAGNOSIS — Z91048 Other nonmedicinal substance allergy status: Secondary | ICD-10-CM | POA: Diagnosis not present

## 2017-03-15 DIAGNOSIS — Z833 Family history of diabetes mellitus: Secondary | ICD-10-CM | POA: Diagnosis not present

## 2017-03-15 DIAGNOSIS — I10 Essential (primary) hypertension: Secondary | ICD-10-CM | POA: Diagnosis present

## 2017-03-15 DIAGNOSIS — I519 Heart disease, unspecified: Secondary | ICD-10-CM | POA: Diagnosis not present

## 2017-03-15 DIAGNOSIS — I69351 Hemiplegia and hemiparesis following cerebral infarction affecting right dominant side: Secondary | ICD-10-CM | POA: Diagnosis not present

## 2017-03-15 DIAGNOSIS — I639 Cerebral infarction, unspecified: Secondary | ICD-10-CM | POA: Diagnosis not present

## 2017-03-15 DIAGNOSIS — L89152 Pressure ulcer of sacral region, stage 2: Secondary | ICD-10-CM | POA: Diagnosis present

## 2017-03-15 DIAGNOSIS — I5189 Other ill-defined heart diseases: Secondary | ICD-10-CM | POA: Diagnosis present

## 2017-03-15 DIAGNOSIS — Z880 Allergy status to penicillin: Secondary | ICD-10-CM

## 2017-03-15 DIAGNOSIS — R109 Unspecified abdominal pain: Secondary | ICD-10-CM | POA: Insufficient documentation

## 2017-03-15 DIAGNOSIS — Z7901 Long term (current) use of anticoagulants: Secondary | ICD-10-CM

## 2017-03-15 DIAGNOSIS — E1142 Type 2 diabetes mellitus with diabetic polyneuropathy: Secondary | ICD-10-CM | POA: Diagnosis present

## 2017-03-15 DIAGNOSIS — R338 Other retention of urine: Secondary | ICD-10-CM | POA: Diagnosis present

## 2017-03-15 DIAGNOSIS — Z87891 Personal history of nicotine dependence: Secondary | ICD-10-CM | POA: Diagnosis not present

## 2017-03-15 DIAGNOSIS — K920 Hematemesis: Secondary | ICD-10-CM | POA: Diagnosis not present

## 2017-03-15 DIAGNOSIS — I129 Hypertensive chronic kidney disease with stage 1 through stage 4 chronic kidney disease, or unspecified chronic kidney disease: Secondary | ICD-10-CM | POA: Diagnosis present

## 2017-03-15 DIAGNOSIS — I69322 Dysarthria following cerebral infarction: Secondary | ICD-10-CM | POA: Diagnosis not present

## 2017-03-15 DIAGNOSIS — N179 Acute kidney failure, unspecified: Secondary | ICD-10-CM | POA: Diagnosis present

## 2017-03-15 DIAGNOSIS — R1312 Dysphagia, oropharyngeal phase: Secondary | ICD-10-CM | POA: Diagnosis not present

## 2017-03-15 DIAGNOSIS — Z823 Family history of stroke: Secondary | ICD-10-CM

## 2017-03-15 DIAGNOSIS — K529 Noninfective gastroenteritis and colitis, unspecified: Principal | ICD-10-CM | POA: Diagnosis present

## 2017-03-15 DIAGNOSIS — R310 Gross hematuria: Secondary | ICD-10-CM | POA: Diagnosis not present

## 2017-03-15 DIAGNOSIS — R1311 Dysphagia, oral phase: Secondary | ICD-10-CM | POA: Diagnosis present

## 2017-03-15 DIAGNOSIS — E119 Type 2 diabetes mellitus without complications: Secondary | ICD-10-CM

## 2017-03-15 DIAGNOSIS — R1031 Right lower quadrant pain: Secondary | ICD-10-CM

## 2017-03-15 DIAGNOSIS — Z7982 Long term (current) use of aspirin: Secondary | ICD-10-CM

## 2017-03-15 DIAGNOSIS — Z86718 Personal history of other venous thrombosis and embolism: Secondary | ICD-10-CM

## 2017-03-15 DIAGNOSIS — N390 Urinary tract infection, site not specified: Secondary | ICD-10-CM | POA: Diagnosis present

## 2017-03-15 DIAGNOSIS — N189 Chronic kidney disease, unspecified: Secondary | ICD-10-CM | POA: Diagnosis present

## 2017-03-15 DIAGNOSIS — E860 Dehydration: Secondary | ICD-10-CM | POA: Diagnosis not present

## 2017-03-15 DIAGNOSIS — E1122 Type 2 diabetes mellitus with diabetic chronic kidney disease: Secondary | ICD-10-CM | POA: Diagnosis present

## 2017-03-15 DIAGNOSIS — R1313 Dysphagia, pharyngeal phase: Secondary | ICD-10-CM | POA: Diagnosis present

## 2017-03-15 DIAGNOSIS — Z8249 Family history of ischemic heart disease and other diseases of the circulatory system: Secondary | ICD-10-CM

## 2017-03-15 DIAGNOSIS — K6289 Other specified diseases of anus and rectum: Secondary | ICD-10-CM | POA: Diagnosis present

## 2017-03-15 DIAGNOSIS — E1165 Type 2 diabetes mellitus with hyperglycemia: Secondary | ICD-10-CM | POA: Diagnosis present

## 2017-03-15 DIAGNOSIS — R339 Retention of urine, unspecified: Secondary | ICD-10-CM | POA: Diagnosis not present

## 2017-03-15 DIAGNOSIS — K2901 Acute gastritis with bleeding: Secondary | ICD-10-CM | POA: Diagnosis not present

## 2017-03-15 DIAGNOSIS — L8990 Pressure ulcer of unspecified site, unspecified stage: Secondary | ICD-10-CM | POA: Diagnosis present

## 2017-03-15 LAB — COMPREHENSIVE METABOLIC PANEL
ALBUMIN: 3.6 g/dL (ref 3.5–5.0)
ALT: 11 U/L — ABNORMAL LOW (ref 17–63)
ANION GAP: 16 — AB (ref 5–15)
AST: 17 U/L (ref 15–41)
Alkaline Phosphatase: 88 U/L (ref 38–126)
BILIRUBIN TOTAL: 0.6 mg/dL (ref 0.3–1.2)
BUN: 26 mg/dL — ABNORMAL HIGH (ref 6–20)
CO2: 16 mmol/L — ABNORMAL LOW (ref 22–32)
Calcium: 8.5 mg/dL — ABNORMAL LOW (ref 8.9–10.3)
Chloride: 107 mmol/L (ref 101–111)
Creatinine, Ser: 2.74 mg/dL — ABNORMAL HIGH (ref 0.61–1.24)
GFR calc non Af Amer: 22 mL/min — ABNORMAL LOW (ref >60)
GFR, EST AFRICAN AMERICAN: 26 mL/min — AB (ref >60)
GLUCOSE: 178 mg/dL — AB (ref 65–99)
POTASSIUM: 4.7 mmol/L (ref 3.5–5.1)
SODIUM: 139 mmol/L (ref 135–145)
TOTAL PROTEIN: 6.6 g/dL (ref 6.5–8.1)

## 2017-03-15 LAB — CBC WITH DIFFERENTIAL/PLATELET
BASOS ABS: 0 10*3/uL (ref 0.0–0.1)
BASOS PCT: 0 %
Eosinophils Absolute: 0 10*3/uL (ref 0.0–0.7)
Eosinophils Relative: 0 %
HCT: 44.4 % (ref 39.0–52.0)
Hemoglobin: 13.9 g/dL (ref 13.0–17.0)
LYMPHS PCT: 4 %
Lymphs Abs: 0.7 10*3/uL (ref 0.7–4.0)
MCH: 29.7 pg (ref 26.0–34.0)
MCHC: 31.3 g/dL (ref 30.0–36.0)
MCV: 94.9 fL (ref 78.0–100.0)
MONO ABS: 0.8 10*3/uL (ref 0.1–1.0)
Monocytes Relative: 5 %
NEUTROS ABS: 15.4 10*3/uL — AB (ref 1.7–7.7)
Neutrophils Relative %: 91 %
Platelets: 358 10*3/uL (ref 150–400)
RBC: 4.68 MIL/uL (ref 4.22–5.81)
RDW: 15.4 % (ref 11.5–15.5)
WBC: 17 10*3/uL — ABNORMAL HIGH (ref 4.0–10.5)

## 2017-03-15 LAB — LIPASE, BLOOD: Lipase: 26 U/L (ref 11–51)

## 2017-03-15 LAB — OCCULT BLOOD GASTRIC / DUODENUM (SPECIMEN CUP)
Occult Blood, Gastric: POSITIVE — AB
pH, Gastric: 4

## 2017-03-15 LAB — CBG MONITORING, ED: Glucose-Capillary: 173 mg/dL — ABNORMAL HIGH (ref 65–99)

## 2017-03-15 MED ORDER — SODIUM CHLORIDE 0.9 % IV BOLUS (SEPSIS): 500.0000 mL | Freq: Once | INTRAVENOUS | Status: DC

## 2017-03-15 MED ORDER — ONDANSETRON HCL 4 MG/2ML IJ SOLN
4.0000 mg | Freq: Once | INTRAMUSCULAR | Status: AC
  Administered 2017-03-15: 4 mg via INTRAVENOUS
  Filled 2017-03-15: qty 2

## 2017-03-15 MED ORDER — ACETAMINOPHEN 650 MG RE SUPP: 650.0000 mg | Freq: Four times a day (QID) | RECTAL | Status: DC | PRN

## 2017-03-15 MED ORDER — DEXTROSE 5 % IV SOLN
2.0000 g | INTRAVENOUS | Status: DC
  Administered 2017-03-16 – 2017-03-18 (×4): 2 g via INTRAVENOUS
  Filled 2017-03-15 (×5): qty 2

## 2017-03-15 MED ORDER — SERTRALINE HCL 50 MG PO TABS
100.0000 mg | ORAL_TABLET | Freq: Every day | ORAL | Status: DC
  Administered 2017-03-19: 100 mg via ORAL
  Filled 2017-03-15 (×2): qty 2

## 2017-03-15 MED ORDER — PANTOPRAZOLE SODIUM 40 MG IV SOLR: 40.0000 mg | Freq: Two times a day (BID) | INTRAVENOUS | Status: DC

## 2017-03-15 MED ORDER — ACETAMINOPHEN 325 MG PO TABS: 650.0000 mg | ORAL_TABLET | Freq: Four times a day (QID) | ORAL | Status: DC | PRN

## 2017-03-15 MED ORDER — SODIUM CHLORIDE 0.9 % IV SOLN
INTRAVENOUS | Status: DC
  Administered 2017-03-15 – 2017-03-17 (×2): via INTRAVENOUS

## 2017-03-15 MED ORDER — IPRATROPIUM-ALBUTEROL 0.5-2.5 (3) MG/3ML IN SOLN: 3.0000 mL | RESPIRATORY_TRACT | Status: DC | PRN

## 2017-03-15 MED ORDER — PANTOPRAZOLE SODIUM 40 MG IV SOLR
40.0000 mg | Freq: Once | INTRAVENOUS | Status: AC
  Administered 2017-03-15: 40 mg via INTRAVENOUS
  Filled 2017-03-15: qty 40

## 2017-03-15 MED ORDER — ONDANSETRON HCL 4 MG/2ML IJ SOLN: 4.0000 mg | Freq: Four times a day (QID) | INTRAMUSCULAR | Status: DC | PRN

## 2017-03-15 MED ORDER — SODIUM CHLORIDE 0.9 % IV BOLUS (SEPSIS)
500.0000 mL | Freq: Once | INTRAVENOUS | Status: AC
  Administered 2017-03-15: 500 mL via INTRAVENOUS

## 2017-03-15 MED ORDER — INSULIN ASPART 100 UNIT/ML ~~LOC~~ SOLN
0.0000 [IU] | Freq: Three times a day (TID) | SUBCUTANEOUS | Status: DC
  Administered 2017-03-16: 2 [IU] via SUBCUTANEOUS
  Administered 2017-03-17 – 2017-03-19 (×5): 1 [IU] via SUBCUTANEOUS
  Administered 2017-03-19: 2 [IU] via SUBCUTANEOUS
  Administered 2017-03-19: 1 [IU] via SUBCUTANEOUS

## 2017-03-15 NOTE — ED Notes (Signed)
Date and time results received: 03/15/17 2218   Test: Gastric Occult Critical Value: Positive and pH 4  Name of Provider Notified: Zackowski  Orders Received? Or Actions Taken?: N/A
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
## 2017-03-15 NOTE — ED Triage Notes (Signed)
Pt from home. Called ems due to RLQ pain. A/o.

## 2017-03-15 NOTE — ED Notes (Signed)
Condom cath being placed on pt at this time

## 2017-03-15 NOTE — ED Notes (Signed)
Girlfriend, Clarita Crane [phone] for ER contact

## 2017-03-15 NOTE — ED Notes (Signed)
Not able to draw blood work in fear of blowing vein. Will notify phlebotomist
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
## 2017-03-15 NOTE — ED Notes (Signed)
Pt had BM on self , cleaned up and changed bed linen, condom cath came off tried to re apply, couldn't .

## 2017-03-16 ENCOUNTER — Other Ambulatory Visit: Payer: Self-pay

## 2017-03-16 ENCOUNTER — Encounter (HOSPITAL_COMMUNITY): Payer: Self-pay | Admitting: *Deleted

## 2017-03-16 DIAGNOSIS — N179 Acute kidney failure, unspecified: Secondary | ICD-10-CM

## 2017-03-16 DIAGNOSIS — K920 Hematemesis: Secondary | ICD-10-CM

## 2017-03-16 DIAGNOSIS — K529 Noninfective gastroenteritis and colitis, unspecified: Principal | ICD-10-CM

## 2017-03-16 DIAGNOSIS — R338 Other retention of urine: Secondary | ICD-10-CM

## 2017-03-16 DIAGNOSIS — I10 Essential (primary) hypertension: Secondary | ICD-10-CM

## 2017-03-16 DIAGNOSIS — I519 Heart disease, unspecified: Secondary | ICD-10-CM

## 2017-03-16 DIAGNOSIS — L8990 Pressure ulcer of unspecified site, unspecified stage: Secondary | ICD-10-CM | POA: Diagnosis present

## 2017-03-16 LAB — URINALYSIS, MICROSCOPIC (REFLEX)

## 2017-03-16 LAB — URINALYSIS, ROUTINE W REFLEX MICROSCOPIC
Glucose, UA: NEGATIVE mg/dL
NITRITE: POSITIVE — AB
PH: 5.5 (ref 5.0–8.0)
Protein, ur: >300 mg/dL — AB
Specific Gravity, Urine: 1.02 (ref 1.005–1.030)

## 2017-03-16 LAB — BASIC METABOLIC PANEL
ANION GAP: 13 (ref 5–15)
BUN: 35 mg/dL — ABNORMAL HIGH (ref 6–20)
CHLORIDE: 109 mmol/L (ref 101–111)
CO2: 17 mmol/L — ABNORMAL LOW (ref 22–32)
Calcium: 8.1 mg/dL — ABNORMAL LOW (ref 8.9–10.3)
Creatinine, Ser: 3.91 mg/dL — ABNORMAL HIGH (ref 0.61–1.24)
GFR, EST AFRICAN AMERICAN: 17 mL/min — AB (ref >60)
GFR, EST NON AFRICAN AMERICAN: 14 mL/min — AB (ref >60)
Glucose, Bld: 226 mg/dL — ABNORMAL HIGH (ref 65–99)
POTASSIUM: 4.9 mmol/L (ref 3.5–5.1)
SODIUM: 139 mmol/L (ref 135–145)

## 2017-03-16 LAB — PROTIME-INR
INR: 1.07
Prothrombin Time: 13.8 seconds (ref 11.4–15.2)

## 2017-03-16 LAB — CBC
HEMATOCRIT: 34.7 % — AB (ref 39.0–52.0)
Hemoglobin: 11 g/dL — ABNORMAL LOW (ref 13.0–17.0)
MCH: 29.9 pg (ref 26.0–34.0)
MCHC: 31.7 g/dL (ref 30.0–36.0)
MCV: 94.3 fL (ref 78.0–100.0)
Platelets: 396 10*3/uL (ref 150–400)
RBC: 3.68 MIL/uL — AB (ref 4.22–5.81)
RDW: 15.5 % (ref 11.5–15.5)
WBC: 17.3 10*3/uL — AB (ref 4.0–10.5)

## 2017-03-16 LAB — TYPE AND SCREEN
ABO/RH(D): B POS
ANTIBODY SCREEN: NEGATIVE

## 2017-03-16 LAB — GLUCOSE, CAPILLARY
GLUCOSE-CAPILLARY: 207 mg/dL — AB (ref 65–99)
GLUCOSE-CAPILLARY: 112 mg/dL — AB (ref 65–99)
GLUCOSE-CAPILLARY: 129 mg/dL — AB (ref 65–99)
GLUCOSE-CAPILLARY: 114 mg/dL — AB (ref 65–99)
Glucose-Capillary: 171 mg/dL — ABNORMAL HIGH (ref 65–99)

## 2017-03-16 MED ORDER — PRO-STAT SUGAR FREE PO LIQD
30.0000 mL | Freq: Two times a day (BID) | ORAL | Status: DC
  Administered 2017-03-19: 30 mL via ORAL
  Filled 2017-03-16 (×2): qty 30

## 2017-03-16 MED ORDER — PANTOPRAZOLE SODIUM 40 MG PO TBEC
40.0000 mg | DELAYED_RELEASE_TABLET | Freq: Two times a day (BID) | ORAL | Status: DC
Start: 2017-03-16 — End: 2017-03-17
  Administered 2017-03-16: 40 mg via ORAL
  Filled 2017-03-16: qty 1

## 2017-03-16 MED ORDER — BOOST / RESOURCE BREEZE PO LIQD CUSTOM
1.0000 | Freq: Two times a day (BID) | ORAL | Status: DC
  Administered 2017-03-19: 1 via ORAL

## 2017-03-16 MED ORDER — TAMSULOSIN HCL 0.4 MG PO CAPS
0.4000 mg | ORAL_CAPSULE | Freq: Every day | ORAL | Status: DC
  Administered 2017-03-16 – 2017-03-19 (×3): 0.4 mg via ORAL
  Filled 2017-03-16 (×3): qty 1

## 2017-03-16 NOTE — Progress Notes (Signed)
PROGRESS NOTE    Rick Welch  Welch  DOB: March 05, 1951  DOA: 03/15/2017 PCP: Medicine, [facility]  Brief Admission Hx: Rick Welch is a 67 y.o. 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  He was also noted to have loose, liquidy stools since yesterday evening.  He was admitted with coffee ground emesis and enteritis.   MDM/Assessment & Plan:   1. Acute enteritis with coffee ground emesis - GI consult pending.  Holding eliquis for now.  IV protonix ordered BID. Zofran as needed. IV ceftriaxone ordered.  2. Acute urinary retention - RN placed foley catheter and dark amber urine came out.  Urine sent for analysis.  Start flomax. He reportedly had chronic indwelling foley but it was removed in the last month. 3. ARF - possibly postobstructive from urinary retention, now that foley is in place would repeat labs in AM and hopefully renal function will improve.   4. Leukocytosis - secondary to proctitis and enteritis, possible UTI, continue antibiotics and follow.  5. 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 Following.  6. Cerebrovascular disease - s/p CVA  7. Type 2 diabetes mellitus - continue sliding scale coverage and blood glucose testing.  8. Essential hypertension - blood pressure controlled, following.  9. History of DVT - eliquis on hold pending GI work up.   DVT prophylaxis: SCDs Code Status: Full Family Communication: GF at bedside Disposition Plan:Home Consults called:GI Admission status: Inpatient, med-surg  Consultants:  GI  Subjective: Pt still having abdominal pain.   Objective: Vitals:   03/15/17 2330 03/16/17 0033 03/16/17 0700 03/16/17 0902  BP: 118/63 111/68  (!) 143/58 (!) 129/53  Pulse:  83 87 83  Resp:  19 19 18   Temp:  97.9 F (36.6 C) 98.7 F (37.1 C) 98.7 F (37.1 C)  TempSrc:  Oral Oral Axillary  SpO2:  100% 100% 99%  Weight:  67.5 kg (148 lb 13 oz)      Intake/Output Summary (Last 24 hours) at 03/16/2017 1016 Last data filed at 03/16/2017 0500 Gross per 24 hour  Intake 1055 ml  Output -  Net 1055 ml   Filed Weights   03/16/17 0033  Weight: 67.5 kg (148 lb 13 oz)     REVIEW OF SYSTEMS  As per history otherwise all reviewed and reported negative  Exam:  General exam: awake, arousable, mostly nonverbal, NAD.   Respiratory system:  No increased work of breathing. Cardiovascular system: S1 & S2 heard, RRR.  Gastrointestinal system: Abdomen is nondistended, soft and with generalized tenderness to palpation and suprapubic tenderness. Normal bowel sounds heard. Central nervous system: chronic right hemiparesis, speech impediment.  Extremities: no CCE.  Data Reviewed: Basic Metabolic Panel: Recent Labs  Lab 03/15/17 1751 03/16/17 0650  NA 139 139  K 4.7 4.9  CL 107 109  CO2 16* 17*  GLUCOSE 178* 226*  BUN 26* 35*  CREATININE 2.74* 3.91*  CALCIUM 8.5* 8.1*   Liver Function Tests: Recent Labs  Lab 03/15/17 1751  AST 17  ALT 11*  ALKPHOS 88  BILITOT 0.6  PROT 6.6  ALBUMIN 3.6   Recent Labs  Lab 03/15/17 1751  LIPASE 26   No results for input(s): AMMONIA in the last 168 hours. CBC: Recent Labs  Lab 03/15/17 1751 03/16/17 0650  WBC 17.0* 17.3*  NEUTROABS 15.4*  --   HGB 13.9 11.0*  HCT 44.4 34.7*  MCV 94.9 94.3  PLT 358 396   Cardiac Enzymes: No results for input(s): CKTOTAL, CKMB, CKMBINDEX, TROPONINI in the last 168 hours. CBG (last 3)  Recent Labs    03/15/17 1741 03/16/17 0900  GLUCAP 173* 207*   No results found for this or any previous visit (from the past 240 hour(s)).   Studies: Ct Abdomen Pelvis Wo Contrast  Result Date: 03/15/2017 CLINICAL DATA:  Abdominal pain.  Abdominal  infection. EXAM: CT ABDOMEN AND PELVIS WITHOUT CONTRAST TECHNIQUE: Multidetector CT imaging of the abdomen and pelvis was performed following the standard protocol without IV contrast. COMPARISON:  No prior abdominal imaging. FINDINGS: Lower chest: Linear right middle lobe paramediastinal scarring is unchanged from chest CT 11/06/2016. Linear atelectasis in the left lower lobe. No pleural effusion. Hepatobiliary: No focal hepatic lesion allowing for lack contrast. Gallbladder physiologically distended, no calcified stone. No biliary dilatation. Small amount perihepatic ascites. Pancreas: No ductal dilatation or inflammation. Spleen: Normal in size without focal abnormality. Small amount perisplenic ascites. Adrenals/Urinary Tract: No adrenal nodule. No hydronephrosis. Moderate bilateral perinephric stranding. No urolithiasis. Small cortical cyst in the mid left kidney is suspected but not well-defined without contrast. Mild bladder distention with bladder base wall thickening versus dependent debris. Stomach/Bowel: Minimal enteric contrast within the stomach and scattered throughout small bowel. There is small bowel wall thickening in the left mid abdomen with adjacent mesenteric fluid suspicious for enteritis, best appreciated coronal image 52 series 5. This is likely short-segment, adjacent small bowel is on opacified limiting assessment. No bowel dilatation to suggest obstruction. Ascites in the right lower quadrant and pericolic gutter likely obscures the appendix which is tentatively identified. No focal periappendiceal inflammation to suggest appendicitis. The majority of the colon is nondistended and not well evaluated. Possible wall thickening of the descending and sigmoid colon versus nondistention. Mild soft tissue stranding about the rectosigmoid colon in the region of possible wall thickening. Scattered colonic diverticulosis without diverticulitis. Vascular/Lymphatic: Mild aortic atherosclerosis. No  aneurysm. Multiple small retroperitoneal nodes. Prominent right external iliac node measures 11 mm short axis. Reproductive: Prostate gland spans 4.9 cm. Other: Small volume mesenteric and abdominopelvic ascites. No free air. No intra-abdominal abscess. Musculoskeletal: Degenerative disc disease and facet arthropathy throughout the lumbar spine. Mild lumbar scoliosis. There are no acute or suspicious osseous abnormalities. IMPRESSION: 1. Small bowel thickening with adjacent mesenteric fluid in the left mid abdomen consistent with enteritis, may be infectious or inflammatory. 2. Pericolonic edema with probable colonic wall thickening involving the rectosigmoid colon which may be related to similar small bowel process versus proctitis. 3. Dependent debris versus small thickening at the bladder base. Edematous kidneys bilaterally. Recommend correlation with urinalysis to evaluate for urinary tract infection. 4. Small volume mesenteric and abdominopelvic ascites, likely reactive. 5. Incidental diverticulosis without diverticulitis. Aortic Atherosclerosis (ICD10-I70.0). Electronically Signed   By: Jeb Levering M.D.   On: 03/15/2017 22:27   Dg Chest Port 1 View  Result Date: 03/15/2017 CLINICAL DATA:  Benign Schulte valuation for but with steatosis. EXAM: PORTABLE CHEST 1 VIEW COMPARISON:  Prior radiograph are pulse since 16 for FINDINGS: Cardiac and mediastinal silhouettes are stable in size and contour, and remain within normal limits. Lungs normally inflated. Right perihilar atelectasis/ scarring again noted, stable. No focal infiltrates. No pulmonary edema or pleural effusion. No pneumothorax. Osseous structures unchanged. IMPRESSION: 1. No active cardiopulmonary disease. 2.  Right perihilar atelectasis/scarring, stable. Electronically Signed   By: Jeannine Boga M.D.   On: 03/15/2017 23:38     Scheduled Meds: . insulin aspart  0-9 Units Subcutaneous TID WC  . pantoprazole  40 mg Intravenous Q12H  .  sertraline  100 mg Oral Daily   Continuous Infusions: . sodium chloride 75 mL/hr at 03/15/17 2216  . cefTRIAXone (ROCEPHIN)  IV Stopped (03/16/17 0349)  . sodium chloride      Principal Problem:   Enteritis Active Problems:   Diabetes ([facility])   HTN (hypertension)   Diastolic dysfunction   CVA (cerebral vascular accident) ([place])   AKI (acute kidney injury) ([place])   Coffee ground emesis   Pressure injury of skin   Acute urinary retention   Time spent:   Irwin Brakeman, MD, FAAFP [facility] Pager [phone]  If 7PM-7AM, please contact night-coverage [email or web address] Password [email or web address] 03/16/2017, 10:16 AM    LOS: 1 day

## 2017-03-16 NOTE — ED Notes (Signed)
Report to 300
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
## 2017-03-16 NOTE — Progress Notes (Signed)
Patient has not voided during shift, although had an episode of incontinence upon arrival to floor.  Bladder scan performed and showed less than 100cc of urine in bladder.

## 2017-03-16 NOTE — Progress Notes (Signed)
Initial Nutrition Assessment  DOCUMENTATION CODES:  Not applicable  INTERVENTION:  Once diet is advanced to clears, Boost Breeze po and prostat 30 ml BID  Would transition to glucerna BID once diet to full liquids  NUTRITION DIAGNOSIS:  Inadequate oral intake related to vomiting, acute illness(Enteritis) as evidenced by per patient/family report.  GOAL:  Patient will meet greater than or equal to 90% of their needs  MONITOR:  PO intake, Supplement acceptance, Diet advancement, Labs, Weight trends  REASON FOR ASSESSMENT:  Malnutrition Screening Tool    ASSESSMENT:  67 y/o male PMHx HTN, DM2, HF,Prior CVA x2 w/ spech defect, asthma. Presented to ED due to RLQ abdominal pain and loose, liquid stools x 24 hrs. Worked up for dehydration and imaging suggests enteritis. Admitted for rehydration and management.   RD operating remotely. Pt indicated on MST that he had not been eating well due to a poor appetite and was unsure if he had lost weight.   Pt had poor appetite for reportedly several days. Information retrieval was apparently difficult due to dysarthria   Per Care Everywhere, patient was 196 lbs this past June. Patient has a history of heart failure and was admitted to [facility] in August and was diuresed at that time. At initial cardiology appointment in October, pt was 167 lbs. His admit weight was ~149 lbs, but he was quite dehydrated on admission.   Pt had coffee ground emesis while in ED and is currently NPO pending GI eval. Unable to implement any interventions at this time. Will preemptively order clear supplements for when diet is advanced to CLs.   Physical Exam: Unable to conduct  Labs: BGs: 170-230, WBC: 17.3, BUN/Creat: 35/3.91 (increased from yesterday) Meds: PPI, IVF, IV abx  Recent Labs  Lab 03/15/17 1751 03/16/17 0650  NA 139 139  K 4.7 4.9  CL 107 109  CO2 16* 17*  BUN 26* 35*  CREATININE 2.74* 3.91*  CALCIUM 8.5* 8.1*  GLUCOSE 178* 226*    NUTRITION - FOCUSED PHYSICAL EXAM: Unable to conduct  Diet Order:  Diet NPO time specified Except for: Ice Chips, Sips with Meds  EDUCATION NEEDS:  No education needs have been identified at this time  Skin: PU stage II to sacrum   Last BM:  Unknown  Height:  Ht Readings from Last 1 Encounters:  02/15/17 5\' 10"  (1.778 m)   Weight:  Wt Readings from Last 1 Encounters:  03/16/17 148 lb 13 oz (67.5 kg)   Wt Readings from Last 10 Encounters:  03/16/17 148 lb 13 oz (67.5 kg)  02/15/17 167 lb (75.8 kg)  12/19/16 180 lb (81.6 kg)  12/10/16 167 lb (75.8 kg)  09/08/13 204 lb (92.5 kg)  02/24/13 201 lb (91.2 kg)  02/02/13 211 lb (95.7 kg)  09/25/11 203 lb (92.1 kg)  02/09/11 210 lb (95.3 kg)   Ideal Body Weight:  67.64 kg  BMI:  Body mass index is 21.35 kg/m.  Estimated Nutritional Needs:  Kcal:  1900-2100 (28-31 kcal/kg bw) Protein:  88-101 g Pro (1.3-1.5 g/kg bw) Fluid:  >2.1 L (30 ml/kg bw)  Burtis Junes RD, LDN, CNSC Clinical Nutrition Pager: [phone] 03/16/2017 1:41 PM

## 2017-03-17 DIAGNOSIS — R1031 Right lower quadrant pain: Secondary | ICD-10-CM

## 2017-03-17 DIAGNOSIS — R1310 Dysphagia, unspecified: Secondary | ICD-10-CM

## 2017-03-17 LAB — GLUCOSE, CAPILLARY
GLUCOSE-CAPILLARY: 141 mg/dL — AB (ref 65–99)
GLUCOSE-CAPILLARY: 107 mg/dL — AB (ref 65–99)
GLUCOSE-CAPILLARY: 110 mg/dL — AB (ref 65–99)

## 2017-03-17 LAB — COMPREHENSIVE METABOLIC PANEL
ALK PHOS: 71 U/L (ref 38–126)
ALT: 6 U/L — AB (ref 17–63)
AST: 10 U/L — ABNORMAL LOW (ref 15–41)
Albumin: 2.9 g/dL — ABNORMAL LOW (ref 3.5–5.0)
Anion gap: 11 (ref 5–15)
BUN: 32 mg/dL — AB (ref 6–20)
CALCIUM: 8.3 mg/dL — AB (ref 8.9–10.3)
CO2: 19 mmol/L — AB (ref 22–32)
Chloride: 114 mmol/L — ABNORMAL HIGH (ref 101–111)
Creatinine, Ser: 2.19 mg/dL — ABNORMAL HIGH (ref 0.61–1.24)
GFR, EST AFRICAN AMERICAN: 34 mL/min — AB (ref >60)
GFR, EST NON AFRICAN AMERICAN: 29 mL/min — AB (ref >60)
Glucose, Bld: 138 mg/dL — ABNORMAL HIGH (ref 65–99)
Potassium: 4 mmol/L (ref 3.5–5.1)
Sodium: 144 mmol/L (ref 135–145)
Total Bilirubin: 0.2 mg/dL — ABNORMAL LOW (ref 0.3–1.2)
Total Protein: 6 g/dL — ABNORMAL LOW (ref 6.5–8.1)

## 2017-03-17 LAB — CBC
HEMATOCRIT: 31.5 % — AB (ref 39.0–52.0)
Hemoglobin: 9.9 g/dL — ABNORMAL LOW (ref 13.0–17.0)
MCH: 29.4 pg (ref 26.0–34.0)
MCHC: 31.4 g/dL (ref 30.0–36.0)
MCV: 93.5 fL (ref 78.0–100.0)
Platelets: 318 10*3/uL (ref 150–400)
RBC: 3.37 MIL/uL — ABNORMAL LOW (ref 4.22–5.81)
RDW: 15.7 % — ABNORMAL HIGH (ref 11.5–15.5)
WBC: 10.1 10*3/uL (ref 4.0–10.5)

## 2017-03-17 MED ORDER — ORAL CARE MOUTH RINSE
15.0000 mL | Freq: Two times a day (BID) | OROMUCOSAL | Status: DC
  Administered 2017-03-17 – 2017-03-19 (×6): 15 mL via OROMUCOSAL

## 2017-03-17 MED ORDER — DEXTROSE-NACL 5-0.9 % IV SOLN
INTRAVENOUS | Status: DC
  Administered 2017-03-17 – 2017-03-19 (×3): via INTRAVENOUS

## 2017-03-17 MED ORDER — PANTOPRAZOLE SODIUM 40 MG IV SOLR
40.0000 mg | Freq: Two times a day (BID) | INTRAVENOUS | Status: DC
  Administered 2017-03-17 – 2017-03-19 (×4): 40 mg via INTRAVENOUS
  Filled 2017-03-17 (×4): qty 40

## 2017-03-17 MED ORDER — CHLORHEXIDINE GLUCONATE 0.12 % MT SOLN
15.0000 mL | Freq: Two times a day (BID) | OROMUCOSAL | Status: DC
  Administered 2017-03-17 – 2017-03-19 (×3): 15 mL via OROMUCOSAL
  Filled 2017-03-17 (×3): qty 15

## 2017-03-17 MED ORDER — APIXABAN 2.5 MG PO TABS
2.5000 mg | ORAL_TABLET | Freq: Two times a day (BID) | ORAL | Status: DC
  Administered 2017-03-18 – 2017-03-19 (×2): 2.5 mg via ORAL
  Filled 2017-03-17 (×2): qty 1

## 2017-03-17 NOTE — Progress Notes (Signed)
Pt given water. PT became strangled and continuously coughed. HOB at a 90 degree angle. PT provided sips with mouth swabs and continued to cough. PT unable to cough and deep breathe, clearing secretions. Speech very delayed and garbled.

## 2017-03-17 NOTE — Progress Notes (Signed)
Patient ID: Rick Welch, male   DOB: 1951-01-03, 67 y.o.   MRN: [id number]   Assessment/Plan: ADMITTED WITH ENTERITIS. CLINICALLY IMPROVED.  PLAN: 1. SPEECH PATH FOR SWALLOWING EVAL IN AM. NPO AFTER MN.  2. CHANGE PROTONIX TO IV. 3. IF FAIL SWALLOWING EVAL PT MAY NEED A PEG. HE INITIALLY STATED HE DOES NOT WANT A FEEDING TUBE. WILL DISCUSS WITH HIM AGAIN AFTER SWALLOWING EVAL AND DISCUSS WITH POA(GIRLFRIEND)   Subjective: Since I last evaluated the patient NURSING CONCERNED PT IS ASPIRATING. CURRENTLY ON A FULL LIQUID DIET. STILL C/O ABDOMINAL PAIN.   Objective: Vital signs in last 24 hours: Vitals:   03/16/17 2130 03/17/17 0627  BP: (!) 132/54 130/61  Pulse: 81 83  Resp: 20 18  Temp: 98.1 F (36.7 C) 99.3 F (37.4 C)  SpO2: 100% 98%   General appearance: alert, cooperative and no distress Resp: clear to auscultation bilaterally Cardio: regular rate and rhythm GI: soft, non-tender; bowel sounds normal;   Lab Results:  K 4.0 Cr 2.19 Hb 9.9   Studies/Results: No results found.  Medications: I have reviewed the patient's current medications.   LOS: 5 days

## 2017-03-17 NOTE — Progress Notes (Signed)
All oral medications have been held today by this RN. Pt is confused and unable to swallow medications at this time. Awaiting SLP consult for further instructions.  Celestia Khat, RN
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
## 2017-03-17 NOTE — Progress Notes (Signed)
PROGRESS NOTE  Rick Welch  AYT:[id number]  DOB: Sep 14, 1950  DOA: 03/15/2017 PCP: Medicine, [facility]  Brief Admission Hx: Rick Welch is a 67 y.o. 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  He was also noted to have loose, liquidy stools since yesterday evening.  He was admitted with coffee ground emesis and enteritis.   MDM/Assessment & Plan:   1. Acute enteritis with coffee ground emesis - GI following.  Can resume eliquis per GI.  Protonix ordered BID. Zofran as needed. IV ceftriaxone ordered. WBC trending down.  2. Acute urinary retention - RN placed foley catheter and dark amber urine came out.  Urine sent for analysis.  Start flomax. He reportedly had chronic indwelling foley but it was removed 3 days PTA by home health but no void trial was done. 3. UTI - Continue IV ceftriaxone pending urine culture findings.  4. ARF - suspect mostly postobstructive from urinary retention, but he likely has some prerenal component as he was very dehydrated on admission.  now that foley is in place would repeat labs in AM and hopefully renal function will continue to improve.   5. Leukocytosis - WBC trending down now with current treatment, likely secondary to proctitis and enteritis, UTI, continue antibiotics and follow.  6. Anemia in renal failure - hemodilutional from IVFs, repeat in AM. Following.  7. Acute blood loss anemia - Pt has a lot of hematuria after foley was placed. Will follow.   8. Gross Hematuria - I question if most of this was from foley placement, urine is clear now, will follow.  9. Cerebrovascular disease - s/p CVA- I re-examined today.  He appears at his baseline from yesterday, no acute changes.  In  fact, he seemed more alert and vocalized more with me this morning.  10. Type 2 diabetes mellitus - continue sliding scale coverage and blood glucose testing.  11. Essential hypertension - blood pressure controlled, following.  12. History of DVT - per GI ok to resume eliquis.   13. Dysphagia - Waiting on SLP evaluation.  Aspiration precautions ordered.   DVT prophylaxis: SCDs Code Status: Full Family Communication: GF at bedside Disposition Plan:Home Consults called:GI Admission status: Inpatient, med-surg  Consultants:  GI  Subjective: Pt is a lot more vocal this morning and does vocalize yes/no answers. Cooperative. Very flat affect.     Objective: Vitals:   03/16/17 2130 03/16/17 2230 03/17/17 0500 03/17/17 0627  BP: (!) 132/54   130/61  Pulse: 81   83  Resp: 20   18  Temp: 98.1 F (36.7 C)   99.3 F (37.4 C)  TempSrc: Axillary   Oral  SpO2: 100%   98%  Weight:  66.8 kg (147 lb 3.2 oz) 66.8 kg (147 lb 4.3 oz)   Height:  5\' 10"  (1.778 m)      Intake/Output Summary (Last 24 hours) at 03/17/2017 0923 Last data filed at 03/17/2017 3557 Gross per 24 hour  Intake 120 ml  Output 1150 ml  Net -1030 ml   Filed Weights   03/16/17 0033 03/16/17 2230 03/17/17 0500  Weight: 67.5 kg (148 lb 13 oz) 66.8 kg (147 lb 3.2 oz) 66.8 kg (147 lb 4.3 oz)     REVIEW OF SYSTEMS  As  per history otherwise all reviewed and reported negative  Exam:  General exam: awake, arousable, mostly nonverbal but does vocalize yes/no, NAD.  FLAT AFFECT Respiratory system:  BBS shallow No increased work of breathing. Cardiovascular system: S1 & S2 heard, RRR.  Gastrointestinal system: Abdomen is nondistended, soft and with generalized tenderness to palpation and suprapubic tenderness. Normal bowel sounds heard. Central nervous system: chronic right hemiparesis, chronic speech impediment.  Extremities: no CCE.  Data Reviewed: Basic Metabolic Panel: Recent Labs  Lab 03/15/17 1751 03/16/17 0650  03/17/17 0759  NA 139 139 144  K 4.7 4.9 4.0  CL 107 109 114*  CO2 16* 17* 19*  GLUCOSE 178* 226* 138*  BUN 26* 35* 32*  CREATININE 2.74* 3.91* 2.19*  CALCIUM 8.5* 8.1* 8.3*   Liver Function Tests: Recent Labs  Lab 03/15/17 1751 03/17/17 0759  AST 17 10*  ALT 11* 6*  ALKPHOS 88 71  BILITOT 0.6 0.2*  PROT 6.6 6.0*  ALBUMIN 3.6 2.9*   Recent Labs  Lab 03/15/17 1751  LIPASE 26   No results for input(s): AMMONIA in the last 168 hours. CBC: Recent Labs  Lab 03/15/17 1751 03/16/17 0650 03/17/17 0759  WBC 17.0* 17.3* 10.1  NEUTROABS 15.4*  --   --   HGB 13.9 11.0* 9.9*  HCT 44.4 34.7* 31.5*  MCV 94.9 94.3 93.5  PLT 358 396 318   Cardiac Enzymes: No results for input(s): CKTOTAL, CKMB, CKMBINDEX, TROPONINI in the last 168 hours. CBG (last 3)  Recent Labs    03/16/17 1824 03/16/17 2134 03/17/17 0732  GLUCAP 129* 114* 141*   No results found for this or any previous visit (from the past 240 hour(s)).   Studies: Ct Abdomen Pelvis Wo Contrast  Result Date: 03/15/2017 CLINICAL DATA:  Abdominal pain.  Abdominal infection. EXAM: CT ABDOMEN AND PELVIS WITHOUT CONTRAST TECHNIQUE: Multidetector CT imaging of the abdomen and pelvis was performed following the standard protocol without IV contrast. COMPARISON:  No prior abdominal imaging. FINDINGS: Lower chest: Linear right middle lobe paramediastinal scarring is unchanged from chest CT 11/06/2016. Linear atelectasis in the left lower lobe. No pleural effusion. Hepatobiliary: No focal hepatic lesion allowing for lack contrast. Gallbladder physiologically distended, no calcified stone. No biliary dilatation. Small amount perihepatic ascites. Pancreas: No ductal dilatation or inflammation. Spleen: Normal in size without focal abnormality. Small amount perisplenic ascites. Adrenals/Urinary Tract: No adrenal nodule. No hydronephrosis. Moderate bilateral perinephric stranding. No urolithiasis. Small cortical cyst in the mid left kidney  is suspected but not well-defined without contrast. Mild bladder distention with bladder base wall thickening versus dependent debris. Stomach/Bowel: Minimal enteric contrast within the stomach and scattered throughout small bowel. There is small bowel wall thickening in the left mid abdomen with adjacent mesenteric fluid suspicious for enteritis, best appreciated coronal image 52 series 5. This is likely short-segment, adjacent small bowel is on opacified limiting assessment. No bowel dilatation to suggest obstruction. Ascites in the right lower quadrant and pericolic gutter likely obscures the appendix which is tentatively identified. No focal periappendiceal inflammation to suggest appendicitis. The majority of the colon is nondistended and not well evaluated. Possible wall thickening of the descending and sigmoid colon versus nondistention. Mild soft tissue stranding about the rectosigmoid colon in the region of possible wall thickening. Scattered colonic diverticulosis without diverticulitis. Vascular/Lymphatic: Mild aortic atherosclerosis. No aneurysm. Multiple small retroperitoneal nodes. Prominent right external iliac node measures 11 mm short axis. Reproductive: Prostate gland spans 4.9 cm. Other: Small volume mesenteric and abdominopelvic ascites. No  free air. No intra-abdominal abscess. Musculoskeletal: Degenerative disc disease and facet arthropathy throughout the lumbar spine. Mild lumbar scoliosis. There are no acute or suspicious osseous abnormalities. IMPRESSION: 1. Small bowel thickening with adjacent mesenteric fluid in the left mid abdomen consistent with enteritis, may be infectious or inflammatory. 2. Pericolonic edema with probable colonic wall thickening involving the rectosigmoid colon which may be related to similar small bowel process versus proctitis. 3. Dependent debris versus small thickening at the bladder base. Edematous kidneys bilaterally. Recommend correlation with urinalysis to  evaluate for urinary tract infection. 4. Small volume mesenteric and abdominopelvic ascites, likely reactive. 5. Incidental diverticulosis without diverticulitis. Aortic Atherosclerosis (ICD10-I70.0). Electronically Signed   By: Jeb Levering M.D.   On: 03/15/2017 22:27   Dg Chest Port 1 View  Result Date: 03/15/2017 CLINICAL DATA:  Benign Schulte valuation for but with steatosis. EXAM: PORTABLE CHEST 1 VIEW COMPARISON:  Prior radiograph are pulse since 16 for FINDINGS: Cardiac and mediastinal silhouettes are stable in size and contour, and remain within normal limits. Lungs normally inflated. Right perihilar atelectasis/ scarring again noted, stable. No focal infiltrates. No pulmonary edema or pleural effusion. No pneumothorax. Osseous structures unchanged. IMPRESSION: 1. No active cardiopulmonary disease. 2. Right perihilar atelectasis/scarring, stable. Electronically Signed   By: Jeannine Boga M.D.   On: 03/15/2017 23:38     Scheduled Meds: . chlorhexidine  15 mL Mouth Rinse BID  . feeding supplement  1 Container Oral BID BM  . feeding supplement (PRO-STAT SUGAR FREE 64)  30 mL Oral BID  . insulin aspart  0-9 Units Subcutaneous TID WC  . mouth rinse  15 mL Mouth Rinse q12n4p  . pantoprazole  40 mg Oral BID AC  . sertraline  100 mg Oral Daily  . tamsulosin  0.4 mg Oral QPC supper   Continuous Infusions: . sodium chloride 75 mL/hr at 03/17/17 0857  . cefTRIAXone (ROCEPHIN)  IV Stopped (03/17/17 0115)  . sodium chloride      Principal Problem:   Enteritis Active Problems:   Diabetes (HCC)   HTN (hypertension)   Diastolic dysfunction   CVA (cerebral vascular accident) ([place])   AKI (acute kidney injury) ([place])   Coffee ground emesis   Pressure injury of skin   Acute urinary retention  Time spent:   Irwin Brakeman, MD, FAAFP [facility] Pager [phone]  If 7PM-7AM, please contact night-coverage [email or web address] Password [email or web address] 03/17/2017, 9:23 AM    LOS: 2 days

## 2017-03-17 NOTE — Progress Notes (Signed)
MD contacted due to NIH score of 11. PT speech incomprehensible and garbled. PT unable to make words out. R sided weakness from past CVA. PT very weak and unable to verbalize needs as he did on 03/15/16. NO change in patient's AMS, alertness, and demeanor since start of shift. Definite change since admit on 03/15/16. PT to be visualized by day MD and note to physicians to assess for possible stroke if applicable.

## 2017-03-18 ENCOUNTER — Inpatient Hospital Stay (HOSPITAL_COMMUNITY): Payer: Medicare Other

## 2017-03-18 ENCOUNTER — Encounter (HOSPITAL_COMMUNITY): Payer: Self-pay | Admitting: Family Medicine

## 2017-03-18 DIAGNOSIS — E860 Dehydration: Secondary | ICD-10-CM | POA: Diagnosis present

## 2017-03-18 DIAGNOSIS — I639 Cerebral infarction, unspecified: Secondary | ICD-10-CM

## 2017-03-18 DIAGNOSIS — N179 Acute kidney failure, unspecified: Secondary | ICD-10-CM | POA: Diagnosis present

## 2017-03-18 DIAGNOSIS — K2901 Acute gastritis with bleeding: Secondary | ICD-10-CM

## 2017-03-18 LAB — CBC WITH DIFFERENTIAL/PLATELET
BASOS ABS: 0 10*3/uL (ref 0.0–0.1)
Basophils Relative: 0 %
EOS PCT: 0 %
Eosinophils Absolute: 0 10*3/uL (ref 0.0–0.7)
HEMATOCRIT: 31.9 % — AB (ref 39.0–52.0)
Hemoglobin: 10.1 g/dL — ABNORMAL LOW (ref 13.0–17.0)
LYMPHS ABS: 0.8 10*3/uL (ref 0.7–4.0)
Lymphocytes Relative: 13 %
MCH: 29.8 pg (ref 26.0–34.0)
MCHC: 31.7 g/dL (ref 30.0–36.0)
MCV: 94.1 fL (ref 78.0–100.0)
MONO ABS: 0.5 10*3/uL (ref 0.1–1.0)
MONOS PCT: 7 %
NEUTROS ABS: 5.4 10*3/uL (ref 1.7–7.7)
Neutrophils Relative %: 80 %
PLATELETS: 317 10*3/uL (ref 150–400)
RBC: 3.39 MIL/uL — ABNORMAL LOW (ref 4.22–5.81)
RDW: 15.3 % (ref 11.5–15.5)
WBC: 6.7 10*3/uL (ref 4.0–10.5)

## 2017-03-18 LAB — GLUCOSE, CAPILLARY
GLUCOSE-CAPILLARY: 134 mg/dL — AB (ref 65–99)
Glucose-Capillary: 141 mg/dL — ABNORMAL HIGH (ref 65–99)
Glucose-Capillary: 139 mg/dL — ABNORMAL HIGH (ref 65–99)
Glucose-Capillary: 118 mg/dL — ABNORMAL HIGH (ref 65–99)
Glucose-Capillary: 137 mg/dL — ABNORMAL HIGH (ref 65–99)

## 2017-03-18 LAB — URINE CULTURE: Culture: <10000 — AB

## 2017-03-18 MED ORDER — HYDRALAZINE HCL 20 MG/ML IJ SOLN
5.0000 mg | INTRAMUSCULAR | Status: DC | PRN
Start: 2017-03-18 — End: 2017-03-19

## 2017-03-18 NOTE — Progress Notes (Signed)
Subjective: Difficulty with liquids yesterday, awaiting speech evaluation today. Strangled with liquids. No vomiting. Still with abdominal pain but no diarrhea. Had soft smear of stool today. Difficult to understand verbally but able to answer "yes, no" questions.   Objective: Vital signs in last 24 hours: Temp:  [97.9 F (36.6 C)-98.4 F (36.9 C)] 98.3 F (36.8 C) (01/07 0617) Pulse Rate:  [78-91] 78 (01/07 0617) Resp:  [15-18] 15 (01/07 0617) BP: (134-171)/(69-73) 171/73 (01/07 0617) SpO2:  [95 %-100 %] 100 % (01/07 0617) Last BM Date: 03/15/17(per ed note) General:   Alert and oriented to person, difficult to assess orientation as difficult to understand with speech deficit.  Head:  Normocephalic and atraumatic. Eyes:  No icterus, sclera clear. Conjuctiva pink.  Abdomen:  Bowel sounds present, soft, TTP LUQ without rebound or guarding, non-distended. No HSM or hernias noted.   Intake/Output from previous day: 01/06 0701 - 01/07 0700 In: 4072.5 [P.O.:360; I.V.:3612.5; IV Piggyback:100] Out: 1650 [Urine:1650] Intake/Output this shift: No intake/output data recorded.  Lab Results: Recent Labs    03/15/17 1751 03/16/17 0650 03/17/17 0759  WBC 17.0* 17.3* 10.1  HGB 13.9 11.0* 9.9*  HCT 44.4 34.7* 31.5*  PLT 358 396 318   BMET Recent Labs    03/15/17 1751 03/16/17 0650 03/17/17 0759  NA 139 139 144  K 4.7 4.9 4.0  CL 107 109 114*  CO2 16* 17* 19*  GLUCOSE 178* 226* 138*  BUN 26* 35* 32*  CREATININE 2.74* 3.91* 2.19*  CALCIUM 8.5* 8.1* 8.3*   LFT Recent Labs    03/15/17 1751 03/17/17 0759  PROT 6.6 6.0*  ALBUMIN 3.6 2.9*  AST 17 10*  ALT 11* 6*  ALKPHOS 88 71  BILITOT 0.6 0.2*   PT/INR Recent Labs    03/16/17 0650  LABPROT 13.8  INR 1.07    Assessment: 67 year old male admitted with abdominal pain, diarrhea, coffee-ground emesis. CT with enteritis and pericolonic edema with probably colonic wall thickening involving the rectosigmoid that may  be related to small bowel process vs proctitis. Found to have UTI, acute renal failure. History of CVA (April 2018 and July 2018)  and DVT (Aug 2018) on Eliquis, which was restarted 1/6. Due to dysphagia, strangling with liquids, and concerns for aspiration, Speech Pathology has been consulted for evaluation today.   Coffee-ground emesis: likely secondary to esophagitis, gastritis, less likely malignancy. No further overt GI bleeding. Back on Eliquis. Consider EGD with dilation depending results of speech study. PEG tube was discussed with patient this admission if fails swallowing evaluation, but it is not clear if this would be desired and would need further discussion with patient and power of attorney.   Enteritis: GI pathogen panel was ordered but not completed, no diarrhea. Clinically improved.   Acute blood loss anemia: CBC 13.9 on admission, yesterday 9.9. Notes documented hematuria s/p foley placement. Recheck today. No overt GI bleeding.     Plan: Remain NPO Await speech evaluation today: may need EGD/dilation. PEG tube discussion has been raised with patient earlier this hospitalization, but this may not be his desire. Will need to discuss further depending on speech recommendations Follow H/H: recheck today Continue PPI BID As of note, multiple polyps on prior colonoscopy in 2012 and was due for 3 year surveillance due to advanced adenoma. May not be feasible to pursue due to upper GI issues and not urgent while inpatient. Can consider in future if he improves.   Annitta Needs, PhD, ANP-BC [facility]  LOS: 3 days    03/18/2017, 8:09 AM

## 2017-03-18 NOTE — Progress Notes (Signed)
PROGRESS NOTE  Rick Welch  JOA:[id number]  DOB: Feb 15, 1951  DOA: 03/15/2017 PCP: Medicine, Rick Welch  Brief Admission Hx: Rick Welch is a 67 y.o. 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  He was also noted to have loose, liquidy stools since yesterday evening.  He was admitted with coffee ground emesis and enteritis.   MDM/Assessment & Plan:   1. Acute enteritis with coffee ground emesis - GI following.  Can resume eliquis per GI.  Protonix ordered BID. Zofran as needed. IV ceftriaxone ordered. WBC trending down.  2. Acute urinary retention - RN placed foley catheter and dark amber urine came out.  Urine sent for analysis.  Start flomax. He reportedly had chronic indwelling foley but it was removed 3 days PTA by home health but no void trial was done. 3. UTI - Continue IV ceftriaxone pending urine culture findings.  4. ARF - suspect mostly postobstructive from urinary retention, but he likely has some prerenal component as he was very dehydrated on admission.  now that foley is in place would repeat labs in AM and hopefully renal function will continue to improve.   5. Leukocytosis - WBC trending down now with current treatment, likely secondary to proctitis and enteritis, UTI, continue antibiotics and follow.  6. Anemia in renal failure - hemodilutional from IVFs, repeat in AM. Following.  7. Acute blood loss anemia - Pt has a lot of hematuria after foley was placed. Will follow.   8. Gross Hematuria - I question if most of this was from foley placement, urine is clear now, will follow.  9. Cerebrovascular disease - s/p CVA-  He appears at his baseline, no acute changes.  In fact, he seemed more alert and  vocalized more with me this morning.  10. Type 2 diabetes mellitus - continue sliding scale coverage and blood glucose testing.  11. Essential hypertension - blood pressure controlled, following.  12. History of DVT - per GI ok to resume eliquis.   13. Dysphagia - Waiting on SLP evaluation.  Aspiration precautions ordered. MBS being done today.   DVT prophylaxis: SCDs Code Status: Full Welch Communication: GF at bedside Disposition Plan:Home Consults called:GI Admission status: Inpatient, med-surg  Consultants:  GI  Subjective: Pt interactive today, no complaints.      Objective: Vitals:   03/17/17 0627 03/17/17 1400 03/17/17 2034 03/18/17 0617  BP: 130/61 134/70 (!) 158/69 (!) 171/73  Pulse: 83 91 84 78  Resp: 18 18 18 15   Temp: 99.3 F (37.4 C) 97.9 F (36.6 C) 98.4 F (36.9 C) 98.3 F (36.8 C)  TempSrc: Oral Oral Oral Oral  SpO2: 98% 95% 99% 100%  Weight:      Height:        Intake/Output Summary (Last 24 hours) at 03/18/2017 1311 Last data filed at 03/18/2017 0700 Gross per 24 hour  Intake 1661.25 ml  Output 1650 ml  Net 11.25 ml   Filed Weights   03/16/17 0033 03/16/17 2230 03/17/17 0500  Weight: 67.5 kg (148 lb 13 oz) 66.8 kg (147 lb 3.2 oz) 66.8 kg (147 lb 4.3 oz)    REVIEW OF SYSTEMS  As per history otherwise all reviewed and reported negative  Exam:  General exam: awake, arousable, mostly nonverbal but does  vocalize yes/no, NAD.  FLAT AFFECT Respiratory system:  BBS shallow No increased work of breathing. Cardiovascular system: S1 & S2 heard, RRR.  Gastrointestinal system: Abdomen is nondistended, soft and with generalized tenderness to palpation and suprapubic tenderness. Normal bowel sounds heard. Central nervous system: chronic right hemiparesis, chronic speech impediment.  Extremities: no CCE.  Data Reviewed: Basic Metabolic Panel: Recent Labs  Lab 03/15/17 1751 03/16/17 0650 03/17/17 0759  NA 139 139 144  K 4.7 4.9 4.0  CL 107 109  114*  CO2 16* 17* 19*  GLUCOSE 178* 226* 138*  BUN 26* 35* 32*  CREATININE 2.74* 3.91* 2.19*  CALCIUM 8.5* 8.1* 8.3*   Liver Function Tests: Recent Labs  Lab 03/15/17 1751 03/17/17 0759  AST 17 10*  ALT 11* 6*  ALKPHOS 88 71  BILITOT 0.6 0.2*  PROT 6.6 6.0*  ALBUMIN 3.6 2.9*   Recent Labs  Lab 03/15/17 1751  LIPASE 26   No results for input(s): AMMONIA in the last 168 hours. CBC: Recent Labs  Lab 03/15/17 1751 03/16/17 0650 03/17/17 0759 03/18/17 1126  WBC 17.0* 17.3* 10.1 6.7  NEUTROABS 15.4*  --   --  5.4  HGB 13.9 11.0* 9.9* 10.1*  HCT 44.4 34.7* 31.5* 31.9*  MCV 94.9 94.3 93.5 94.1  PLT 358 396 318 317   Cardiac Enzymes: No results for input(s): CKTOTAL, CKMB, CKMBINDEX, TROPONINI in the last 168 hours. CBG (last 3)  Recent Labs    03/17/17 2037 03/18/17 0754 03/18/17 1127  GLUCAP 110* 141* 139*   Recent Results (from the past 240 hour(s))  Culture, Urine     Status: Abnormal   Collection Time: 03/16/17  3:37 PM  Result Value Ref Range Status   Specimen Description URINE, CLEAN CATCH  Final   Special Requests NONE  Final   Culture (A)  Final    <10,000 COLONIES/mL INSIGNIFICANT GROWTH Performed at [facility], [address] [place], [place] [address]    Report Status 03/18/2017 FINAL  Final     Studies: No results found.   Scheduled Meds: . apixaban  2.5 mg Oral BID  . chlorhexidine  15 mL Mouth Rinse BID  . feeding supplement  1 Container Oral BID BM  . feeding supplement (PRO-STAT SUGAR FREE 64)  30 mL Oral BID  . insulin aspart  0-9 Units Subcutaneous TID WC  . mouth rinse  15 mL Mouth Rinse q12n4p  . pantoprazole (PROTONIX) IV  40 mg Intravenous BID AC  . sertraline  100 mg Oral Daily  . tamsulosin  0.4 mg Oral QPC supper   Continuous Infusions: . cefTRIAXone (ROCEPHIN)  IV Stopped (03/17/17 2325)  . dextrose 5 % and 0.9% NaCl 75 mL/hr at 03/18/17 0236  . sodium chloride      Principal Problem:   Enteritis Active  Problems:   Diabetes (HCC)   HTN (hypertension)   Diastolic dysfunction   CVA (cerebral vascular accident) ([place])   AKI (acute kidney injury) ([place])   Coffee ground emesis   Pressure injury of skin   Acute urinary retention   Dysphagia, idiopathic  Time spent:   Irwin Brakeman, MD, FAAFP [facility] Pager [phone]  If 7PM-7AM, please contact night-coverage [email or web address] Password [email or web address] 03/18/2017, 1:11 PM    LOS: 3 days

## 2017-03-18 NOTE — Progress Notes (Signed)
[place] for eliquis Indication: h/o DVT, CVA  Allergies  Allergen Reactions  . Iodine Other (See Comments) and Anaphylaxis    THROAT SWELLING THROAT SWELLING  . Penicillins Other (See Comments)    Childhood allergy; uncertain reaction Has patient had a PCN reaction causing immediate rash, facial/tongue/throat swelling, SOB or lightheadedness with hypotension: Unknown Has patient had a PCN reaction causing severe rash involving mucus membranes or skin necrosis: Unknown Has patient had a PCN reaction that required hospitalization: Unknown Has patient had a PCN reaction occurring within the last 10 years: Unknown If all of the above answers are "NO", then may proceed with Cephalosporin use.     Patient Measurements: Height: 5\' 10"  (177.8 cm) Weight: 147 lb 4.3 oz (66.8 kg) IBW/kg (Calculated) : 73   Vital Signs: Temp: 98.5 F (36.9 C) (01/07 1323) Temp Source: Oral (01/07 1323) BP: 167/75 (01/07 1323) Pulse Rate: 76 (01/07 1323)  Labs: Recent Labs    03/15/17 1751 03/16/17 0650 03/17/17 0759 03/18/17 1126  HGB 13.9 11.0* 9.9* 10.1*  HCT 44.4 34.7* 31.5* 31.9*  PLT 358 396 318 317  LABPROT  --  13.8  --   --   INR  --  1.07  --   --   CREATININE 2.74* 3.91* 2.19*  --     Estimated Creatinine Clearance: 30.5 mL/min (A) (by C-G formula based on SCr of 2.19 mg/dL (H)).  Assessment: 67 yo man to continue eliquis for h/o VTE, CVA.  He was on eliquis 2.5 mg po bid at home.  Goal of Therapy:  Prevention of DVT Monitor platelets by anticoagulation protocol: Yes   Plan:  Continue recurrence prophylaxis dose of eliquis 2.5 mg BID Monitor for bleeding complications Dose stable for age, weight, renal function and indication. Sign off.   Biagio Quint R 03/18/2017,1:36 PM
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
## 2017-03-18 NOTE — Progress Notes (Signed)
   Progress Note   Date: 03/18/2017  Patient Name: Rick Welch        MRN#: [id number]   Clarification of the diagnosis of pressure ulcer(s):   Pressure ulcer Stage 2 of sacrum    Irwin Brakeman, MD

## 2017-03-18 NOTE — Progress Notes (Signed)
Pt arrived back from [facility].

## 2017-03-18 NOTE — Progress Notes (Signed)
Pt transported down for Barium Swallow Study via transport staff.

## 2017-03-19 ENCOUNTER — Telehealth: Payer: Self-pay | Admitting: Gastroenterology

## 2017-03-19 DIAGNOSIS — R1312 Dysphagia, oropharyngeal phase: Secondary | ICD-10-CM

## 2017-03-19 LAB — BASIC METABOLIC PANEL
ANION GAP: 9 (ref 5–15)
BUN: 11 mg/dL (ref 6–20)
CO2: 22 mmol/L (ref 22–32)
Calcium: 8.2 mg/dL — ABNORMAL LOW (ref 8.9–10.3)
Chloride: 113 mmol/L — ABNORMAL HIGH (ref 101–111)
Creatinine, Ser: 1.05 mg/dL (ref 0.61–1.24)
GFR calc Af Amer: >60 mL/min (ref >60)
GLUCOSE: 147 mg/dL — AB (ref 65–99)
POTASSIUM: 3.1 mmol/L — AB (ref 3.5–5.1)
Sodium: 144 mmol/L (ref 135–145)

## 2017-03-19 LAB — CBC
HEMATOCRIT: 31.5 % — AB (ref 39.0–52.0)
Hemoglobin: 10 g/dL — ABNORMAL LOW (ref 13.0–17.0)
MCH: 29.7 pg (ref 26.0–34.0)
MCHC: 31.7 g/dL (ref 30.0–36.0)
MCV: 93.5 fL (ref 78.0–100.0)
PLATELETS: 317 10*3/uL (ref 150–400)
RBC: 3.37 MIL/uL — AB (ref 4.22–5.81)
RDW: 15.1 % (ref 11.5–15.5)
WBC: 5.3 10*3/uL (ref 4.0–10.5)

## 2017-03-19 LAB — GLUCOSE, CAPILLARY
Glucose-Capillary: 141 mg/dL — ABNORMAL HIGH (ref 65–99)
Glucose-Capillary: 173 mg/dL — ABNORMAL HIGH (ref 65–99)
Glucose-Capillary: 139 mg/dL — ABNORMAL HIGH (ref 65–99)

## 2017-03-19 MED ORDER — PANTOPRAZOLE SODIUM 20 MG PO TBEC: 20.0000 mg | DELAYED_RELEASE_TABLET | Freq: Two times a day (BID) | ORAL | 0 refills | Status: AC

## 2017-03-19 MED ORDER — POTASSIUM CHLORIDE 20 MEQ/15ML (10%) PO SOLN
40.0000 meq | Freq: Once | ORAL | Status: AC
  Administered 2017-03-19: 40 meq via ORAL
  Filled 2017-03-19: qty 30

## 2017-03-19 MED ORDER — TAMSULOSIN HCL 0.4 MG PO CAPS: 0.4000 mg | ORAL_CAPSULE | Freq: Every day | ORAL | 0 refills | Status: AC

## 2017-03-19 MED ORDER — AMLODIPINE BESYLATE 5 MG PO TABS
5.0000 mg | ORAL_TABLET | Freq: Every day | ORAL | Status: DC
  Administered 2017-03-19: 5 mg via ORAL
  Filled 2017-03-19: qty 1

## 2017-03-19 MED ORDER — GLUCERNA PO LIQD
237.0000 mL | Freq: Two times a day (BID) | ORAL | Status: DC
  Administered 2017-03-19: 237 mL via ORAL

## 2017-03-19 MED ORDER — GLUCERNA PO LIQD: 237.0000 mL | Freq: Two times a day (BID) | ORAL | 0 refills | Status: AC

## 2017-03-19 MED ORDER — AMLODIPINE BESYLATE 5 MG PO TABS: 5.0000 mg | ORAL_TABLET | Freq: Every day | ORAL | 0 refills | Status: AC
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
## 2017-03-19 NOTE — Progress Notes (Addendum)
Stated understanding of discharge instructions. Sonia Baller at [facility] faxed patient's instructions and time patient should be picked up by EMS for transport home. A message was left with case manager about need for home health lab draws.

## 2017-03-19 NOTE — Telephone Encounter (Signed)
Please arrange hospital follow-up in 6-8 weeks.

## 2017-03-19 NOTE — Discharge Instructions (Signed)
DIET: DYSPHAGIA 1 WITH NECTAR THICKENED LIQUIDS ASPIRATION PRECAUTIONS Prohibited food jello, ice cream, thin soups   Thickening Liquids for Dysphagia Diet If you are on the dysphagia diet, you may need to thicken drinks, soups, foods that melt at room temperature, and other liquids before you drink or eat them. Thickening liquids makes them easier to swallow. It also reduces the risk of liquid traveling to your lungs. To make a thickened liquid you will need to add a commercial thickening product or a soft food to the liquid until it reaches the consistency it needs to be. Your health care provider or dietitian will explain to you the consistency you need to aim for. Liquid consistencies include:  Thin. Thin liquids include most drinks (such as water, milk, tea, soda, juice, carbonated drinks), as well as ice cream, sherbet, sorbet, ice pops, and broth-based soups.  Nectar-like. Nectar-like liquids include maple syrup and creamy soup.  Honey-like. Honey-like liquids are made to be runny but are thick like honey. They cannot be sipped through a straw.  Spoon-thick. Spoon-thick liquids are thick, like pudding.  My plan I should thicken my liquids to a NECTAR consistency. Diet guidelines  Thicken liquids to the consistency your health care provider recommends.  Follow your dietitian's or health care provider's recommendation on how to thicken your liquids.  See your dietitian or health care provider regularly for help with your dietary changes. How can I thicken my liquids? Liquids can be thickened with a commercial food and beverage thickener or with a soft food. [employer] Thickeners A food and beverage thickener is a powder or gel that makes a food or beverage thicker. Thickeners are sold at pharmacies, medical supply stores, some grocery stores, and online. They can be added to both hot and cold liquids and do not change the taste of the liquid. Ask your health care  provider or dietitian for a complete list of commercial thickeners. Each thickening product is different. Some need to be blended into a liquid with a blender while others can be stirred into a liquid with a fork or spoon. Follow the instructions on the product label. Soft Foods Some foods such as soups, casseroles, and gravies can be thickened with soft foods. Soft foods include:  Baby cereal.  Gravy powder.  Mashed potato.  Pureed baby food.  Instant potato flakes.  Powdered sauce mixes (such as cheese mixes).  Flour.  To use one of these soft food items, stir or mix them into the thin liquid until it reaches the desired thickness. Start with a small amount and adjust soft food and liquid as necessary. Note: Flour works best with warm liquids, such as broth. To thicken a liquid with flour, make a paste out of flour and water. Cook or warm your liquid and add the paste to it. Stir until the mixture thickens. What are some tips to make thickening liquids easier?  Take thickeners with you when eating out or traveling.  If a liquid gets too thick, add more of the thinner liquid until the desired consistency is reached.  Consider purchasing pre-made thickened drinks.  Consider using a thickening product to make your own frozen desserts. This information is not intended to replace advice given to you by your health care provider. Make sure you discuss any questions you have with your health care provider. Document Released: 08/28/2011 Document Revised: 08/04/2015 Document Reviewed: 02/09/2013 Elsevier Interactive Patient Education  2017 [employer]   Dehydration, Adult Dehydration is when  there is not enough fluid or water in your body. This happens when you lose more fluids than you take in. Dehydration can range from mild to very bad. It should be treated right away to keep it from getting very bad. Symptoms of mild dehydration may include:  Thirst.  Dry lips.  Slightly  dry mouth.  Dry, warm skin.  Dizziness. Symptoms of moderate dehydration may include:  Very dry mouth.  Muscle cramps.  Dark pee (urine). Pee may be the color of tea.  Your body making less pee.  Your eyes making fewer tears.  Heartbeat that is uneven or faster than normal (palpitations).  Headache.  Light-headedness, especially when you stand up from sitting.  Fainting (syncope). Symptoms of very bad dehydration may include:  Changes in skin, such as: ? Cold and clammy skin. ? Blotchy (mottled) or pale skin. ? Skin that does not quickly return to normal after being lightly pinched and let go (poor skin turgor).  Changes in body fluids, such as: ? Feeling very thirsty. ? Your eyes making fewer tears. ? Not sweating when body temperature is high, such as in hot weather. ? Your body making very little pee.  Changes in vital signs, such as: ? Weak pulse. ? Pulse that is more than 100 beats a minute when you are sitting still. ? Fast breathing. ? Low blood pressure.  Other changes, such as: ? Sunken eyes. ? Cold hands and feet. ? Confusion. ? Lack of energy (lethargy). ? Trouble waking up from sleep. ? Short-term weight loss. ? Unconsciousness. Follow these instructions at home:  If told by your doctor, drink an ORS: ? Make an ORS by using instructions on the package. ? Start by drinking small amounts, about  cup (120 mL) every 5-10 minutes. ? Slowly drink more until you have had the amount that your doctor said to have.  Drink enough clear fluid to keep your pee clear or pale yellow. If you were told to drink an ORS, finish the ORS first, then start slowly drinking clear fluids. Drink fluids such as: ? Water. Do not drink only water by itself. Doing that can make the salt (sodium) level in your body get too low (hyponatremia). ? Ice chips. ? Fruit juice that you have added water to (diluted). ? Low-calorie sports drinks.  Avoid: ? Alcohol. ? Drinks that  have a lot of sugar. These include high-calorie sports drinks, fruit juice that does not have water added, and soda. ? Caffeine. ? Foods that are greasy or have a lot of fat or sugar.  Take over-the-counter and prescription medicines only as told by your doctor.  Do not take salt tablets. Doing that can make the salt level in your body get too high (hypernatremia).  Eat foods that have minerals (electrolytes). Examples include bananas, oranges, potatoes, tomatoes, and spinach.  Keep all follow-up visits as told by your doctor. This is important. Contact a doctor if:  You have belly (abdominal) pain that: ? Gets worse. ? Stays in one area (localizes).  You have a rash.  You have a stiff neck.  You get angry or annoyed more easily than normal (irritability).  You are more sleepy than normal.  You have a harder time waking up than normal.  You feel: ? Weak. ? Dizzy. ? Very thirsty.  You have peed (urinated) only a small amount of very dark pee during 6-8 hours. Get help right away if:  You have symptoms of very bad dehydration.  You cannot drink fluids without throwing up (vomiting).  Your symptoms get worse with treatment.  You have a fever.  You have a very bad headache.  You are throwing up or having watery poop (diarrhea) and it: ? Gets worse. ? Does not go away.  You have blood or something green (bile) in your throw-up.  You have blood in your poop (stool). This may cause poop to look black and tarry.  You have not peed in 6-8 hours.  You pass out (faint).  Your heart rate when you are sitting still is more than 100 beats a minute.  You have trouble breathing. This information is not intended to replace advice given to you by your health care provider. Make sure you discuss any questions you have with your health care provider. Document Released: 12/23/2008 Document Revised: 09/16/2015 Document Reviewed: 04/22/2015 Elsevier Interactive Patient  Education  2018 [employer].

## 2017-03-19 NOTE — Care Management Note (Signed)
Case Management Note  Patient Details  Name: Rick Welch MRN: [id number] Date of Birth: [birth year]  Subjective/Objective:        Admitted with enteritis. Pt from home, requires max assist with ADL's Pt says he has 24/7 care. He has a gf who is a caregiver also. Pt referred for [facility] services at DC. Pt agreeable and has chosen [facility] from list of [facility]. He is aware HH has 48 hrs to make first visit. He plans on gf providing transportation home. He communicates no need or concerns about DC.            Action/Plan: DC home today with self care. CM has notified Blake Divine, [facility] rep, of pt referral. She will pull pt info from chart.   Expected Discharge Date:  03/19/17               Expected Discharge Plan:  [place]  In-House Referral:  NA  Discharge planning Services  CM Consult  Post Acute Care Choice:  Home Health Choice offered to:  Patient  HH Arranged:  RN, [facility] [place] Agency:  [place]  Status of Service:  Completed, signed off  Sherald Barge, RN 03/19/2017, 12:39 PM

## 2017-03-19 NOTE — Care Management (Signed)
CM informed by RN, Velta Addison, that pt will need EMS transport. GF will need to make arrangements for someone to be at home prior to EMS being called. Medical necessity completed and ready for EMS transport once someone is at home to receive pt.

## 2017-03-19 NOTE — Discharge Summary (Addendum)
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 pyriform Pharyngeal Material does not enter airway;Material enters airway, remains ABOVE vocal cords then ejected out Pharyngeal- Puree Delayed swallow initiation-vallecula;Reduced pharyngeal peristalsis;Reduced epiglottic inversion;Reduced tongue base retraction;Pharyngeal residue - valleculae;Pharyngeal residue - pyriform Pharyngeal -- Pharyngeal- Mechanical Soft -- Pharyngeal -- Pharyngeal- Regular -- Pharyngeal -- Pharyngeal- Multi-consistency -- Pharyngeal -- Pharyngeal- Pill -- Pharyngeal -- Pharyngeal Comment --  CHL IP CERVICAL ESOPHAGEAL PHASE 03/18/2017 Cervical Esophageal Phase WFL Pudding Teaspoon -- Pudding Cup -- Honey Teaspoon -- Honey Cup -- Nectar Teaspoon -- Nectar Cup -- Nectar Straw -- Thin Teaspoon -- Thin Cup -- Thin Straw -- Puree -- Mechanical Soft -- Regular -- Multi-consistency -- Pill -- Cervical Esophageal Comment -- Thank you, Genene Churn, [place] No flowsheet data found. PORTER,DABNEY 03/18/2017, 1:50 PM                 Subjective: Pt awake, alert, eating, drinking, speaking.  No complaints, tolerating Dys 1 diet with thickened liquids.   Discharge Exam: Vitals:   03/18/17 2100 03/19/17 0300  BP: (!) 150/63 (!) 174/76  Pulse: 76 69  Resp: 17 18  Temp: 98.5 F (36.9 C) (!) 97.5 F (36.4 C)  SpO2: 99% 100%   Vitals:   03/18/17 0617 03/18/17 1323 03/18/17 2100 03/19/17 0300  BP: (!) 171/73 (!) 167/75 (!) 150/63 (!) 174/76  Pulse: 78 76 76 69  Resp: 15 18 17 18   Temp: 98.3 F (36.8 C) 98.5 F (36.9  C) 98.5 F (36.9 C) (!) 97.5 F (36.4 C)  TempSrc: Oral Oral Oral Oral  SpO2: 100% 99% 99% 100%  Weight:    70.4 kg (155 lb 3.3 oz)  Height:        General: Pt is alert, awake, not in acute distress Cardiovascular: RRR, S1/S2 +, no rubs, no gallops Respiratory: CTA bilaterally, no wheezing, no rhonchi Abdominal: Soft, NT, ND, bowel sounds + Extremities: no edema, no cyanosis   The results of significant diagnostics from this hospitalization (including imaging, microbiology, ancillary and laboratory) are listed below for reference.     Microbiology: Recent Results (from the past 240 hour(s))  Culture, Urine     Status: Abnormal   Collection Time: 03/16/17  3:37 PM  Result Value Ref Range Status   Specimen Description URINE, CLEAN CATCH  Final   Special Requests NONE  Final   Culture (A)  Final    <10,000 COLONIES/mL INSIGNIFICANT GROWTH Performed at [facility], [address], [place], [place] [address]    Report Status 03/18/2017 FINAL  Final     Labs: BNP (last 3 results) No results for input(s): BNP in the last 8760 hours. Basic Metabolic Panel: Recent Labs  Lab 03/15/17 1751 03/16/17 0650 03/17/17 0759 03/19/17 0527  NA 139 139 144 144  K 4.7 4.9 4.0 3.1*  CL 107 109 114* 113*  CO2 16* 17* 19* 22  GLUCOSE 178* 226* 138* 147*  BUN 26* 35* 32* 11  CREATININE 2.74* 3.91* 2.19*  1.05  CALCIUM 8.5* 8.1* 8.3* 8.2*   Liver Function Tests: Recent Labs  Lab 03/15/17 1751 03/17/17 0759  AST 17 10*  ALT 11* 6*  ALKPHOS 88 71  BILITOT 0.6 0.2*  PROT 6.6 6.0*  ALBUMIN 3.6 2.9*   Recent Labs  Lab 03/15/17 1751  LIPASE 26   No results for input(s): AMMONIA in the last 168 hours. CBC: Recent Labs  Lab 03/15/17 1751 03/16/17 0650 03/17/17 0759 03/18/17 1126 03/19/17 0527  WBC 17.0* 17.3* 10.1 6.7 5.3  NEUTROABS 15.4*  --   --  5.4  --   HGB 13.9 11.0* 9.9* 10.1* 10.0*  HCT 44.4 34.7* 31.5* 31.9* 31.5*  MCV 94.9 94.3 93.5 94.1 93.5  PLT 358 396 318 317 317   Cardiac Enzymes: No results for input(s): CKTOTAL, CKMB, CKMBINDEX, TROPONINI in the last 168 hours. BNP: Invalid input(s): POCBNP CBG: Recent Labs  Lab 03/18/17 0754 03/18/17 1127 03/18/17 1651 03/18/17 2107 03/19/17 0742  GLUCAP 141* 139* 118* 137* 141*   D-Dimer No results for input(s): DDIMER in the last 72 hours. Hgb A1c No results for input(s): HGBA1C in the last 72 hours. Lipid Profile No results for input(s): CHOL, HDL, LDLCALC, TRIG, CHOLHDL, LDLDIRECT in the last 72 hours. Thyroid function studies No results for input(s): TSH, T4TOTAL, T3FREE, THYROIDAB in the last 72 hours.  Invalid input(s): FREET3 Anemia work up No results for input(s): VITAMINB12, FOLATE, FERRITIN, TIBC, IRON, RETICCTPCT in the last 72 hours. Urinalysis    Component Value Date/Time   COLORURINE RED (A) 03/15/2017 1735   APPEARANCEUR CLOUDY (A) 03/15/2017 1735   LABSPEC 1.020 03/15/2017 1735   PHURINE 5.5 03/15/2017 1735   GLUCOSEU NEGATIVE 03/15/2017 1735   HGBUR LARGE (A) 03/15/2017 1735   BILIRUBINUR MODERATE (A) 03/15/2017 1735   KETONESUR TRACE (A) 03/15/2017 1735   PROTEINUR >300 (A) 03/15/2017 1735   NITRITE POSITIVE (A) 03/15/2017 1735   LEUKOCYTESUR LARGE (A) 03/15/2017 1735   Sepsis Labs Invalid  input(s): PROCALCITONIN,  WBC,  LACTICIDVEN Microbiology Recent Results (from the past  240 hour(s))  Culture, Urine     Status: Abnormal   Collection Time: 03/16/17  3:37 PM  Result Value Ref Range Status   Specimen Description URINE, CLEAN CATCH  Final   Special Requests NONE  Final   Culture (A)  Final    <10,000 COLONIES/mL INSIGNIFICANT GROWTH Performed at [facility], [address], [place], [place] [address]    Report Status 03/18/2017 FINAL  Final   Time coordinating discharge: 38 mins  SIGNED:  Irwin Brakeman, MD  [facility] 03/19/2017, 11:49 AM Pager [phone]  If 7PM-7AM, please contact night-coverage [email or web address] Password [email or web address]

## 2017-03-19 NOTE — Care Management Important Message (Signed)
Important Message  Patient Details  Name: Rick Welch MRN: [id number] Date of Birth: February 14, 1951   Medicare Important Message Given:  Yes    Sherald Barge, RN 03/19/2017, 12:25 PM

## 2017-03-20 NOTE — Telephone Encounter (Signed)
Patient scheduled.

## 2017-03-24 ENCOUNTER — Encounter (HOSPITAL_COMMUNITY): Payer: Self-pay | Admitting: Emergency Medicine

## 2017-03-24 ENCOUNTER — Other Ambulatory Visit: Payer: Self-pay

## 2017-03-24 ENCOUNTER — Emergency Department (HOSPITAL_COMMUNITY)
Admission: EM | Admit: 2017-03-24 | Discharge: 2017-03-24 | Disposition: A | Discharge status: Home / Self Care | Payer: Medicare Other | Attending: Emergency Medicine | Admitting: Emergency Medicine

## 2017-03-24 DIAGNOSIS — Z0001 Encounter for general adult medical examination with abnormal findings: Secondary | ICD-10-CM | POA: Diagnosis not present

## 2017-03-24 DIAGNOSIS — Z598 Other problems related to housing and economic circumstances: Secondary | ICD-10-CM | POA: Insufficient documentation

## 2017-03-24 DIAGNOSIS — Z591 Inadequate housing: Secondary | ICD-10-CM

## 2017-03-24 DIAGNOSIS — Z008 Encounter for other general examination: Secondary | ICD-10-CM | POA: Diagnosis present
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
## 2017-03-24 NOTE — ED Notes (Addendum)
Pt medically cleared. Director over pt caregiver contacted, Demetrios Loll [phone], and reported staff would be at pt bedside to resume care post discharge at 3pm. Pt and EDP aware.

## 2017-03-26 ENCOUNTER — Emergency Department (HOSPITAL_COMMUNITY)
Admission: EM | Admit: 2017-03-26 | Discharge: 2017-03-26 | Disposition: A | Discharge status: Home / Self Care | Payer: Medicare Other | Source: Home / Self Care | Attending: Emergency Medicine | Admitting: Emergency Medicine

## 2017-03-26 ENCOUNTER — Emergency Department (HOSPITAL_COMMUNITY): Payer: Medicare Other

## 2017-03-26 ENCOUNTER — Other Ambulatory Visit: Payer: Self-pay

## 2017-03-26 ENCOUNTER — Encounter (HOSPITAL_COMMUNITY): Payer: Self-pay | Admitting: Emergency Medicine

## 2017-03-26 DIAGNOSIS — Z79899 Other long term (current) drug therapy: Secondary | ICD-10-CM

## 2017-03-26 DIAGNOSIS — J45909 Unspecified asthma, uncomplicated: Secondary | ICD-10-CM

## 2017-03-26 DIAGNOSIS — Z87891 Personal history of nicotine dependence: Secondary | ICD-10-CM

## 2017-03-26 DIAGNOSIS — E119 Type 2 diabetes mellitus without complications: Secondary | ICD-10-CM | POA: Insufficient documentation

## 2017-03-26 DIAGNOSIS — I10 Essential (primary) hypertension: Secondary | ICD-10-CM | POA: Insufficient documentation

## 2017-03-26 DIAGNOSIS — Z7984 Long term (current) use of oral hypoglycemic drugs: Secondary | ICD-10-CM | POA: Insufficient documentation

## 2017-03-26 DIAGNOSIS — Z7901 Long term (current) use of anticoagulants: Secondary | ICD-10-CM | POA: Insufficient documentation

## 2017-03-26 DIAGNOSIS — Z8673 Personal history of transient ischemic attack (TIA), and cerebral infarction without residual deficits: Secondary | ICD-10-CM

## 2017-03-26 DIAGNOSIS — D72829 Elevated white blood cell count, unspecified: Secondary | ICD-10-CM

## 2017-03-26 DIAGNOSIS — E860 Dehydration: Secondary | ICD-10-CM | POA: Diagnosis not present

## 2017-03-26 LAB — COMPREHENSIVE METABOLIC PANEL
ALT: 8 U/L — ABNORMAL LOW (ref 17–63)
ANION GAP: 16 — AB (ref 5–15)
AST: 13 U/L — ABNORMAL LOW (ref 15–41)
Albumin: 3 g/dL — ABNORMAL LOW (ref 3.5–5.0)
Alkaline Phosphatase: 95 U/L (ref 38–126)
BILIRUBIN TOTAL: 1.3 mg/dL — AB (ref 0.3–1.2)
BUN: 15 mg/dL (ref 6–20)
CO2: 23 mmol/L (ref 22–32)
Calcium: 8.7 mg/dL — ABNORMAL LOW (ref 8.9–10.3)
Chloride: 104 mmol/L (ref 101–111)
Creatinine, Ser: 1.48 mg/dL — ABNORMAL HIGH (ref 0.61–1.24)
GFR calc Af Amer: 54 mL/min — ABNORMAL LOW (ref >60)
GFR, EST NON AFRICAN AMERICAN: 47 mL/min — AB (ref >60)
Glucose, Bld: 209 mg/dL — ABNORMAL HIGH (ref 65–99)
POTASSIUM: 3.1 mmol/L — AB (ref 3.5–5.1)
Sodium: 143 mmol/L (ref 135–145)
TOTAL PROTEIN: 6.6 g/dL (ref 6.5–8.1)

## 2017-03-26 LAB — CBC WITH DIFFERENTIAL/PLATELET
Basophils Absolute: 0 10*3/uL (ref 0.0–0.1)
Basophils Relative: 0 %
Eosinophils Absolute: 0 10*3/uL (ref 0.0–0.7)
Eosinophils Relative: 0 %
HEMATOCRIT: 37.5 % — AB (ref 39.0–52.0)
Hemoglobin: 11.8 g/dL — ABNORMAL LOW (ref 13.0–17.0)
Lymphocytes Relative: 6 %
Lymphs Abs: 1.4 10*3/uL (ref 0.7–4.0)
MCH: 29.1 pg (ref 26.0–34.0)
MCHC: 31.5 g/dL (ref 30.0–36.0)
MCV: 92.4 fL (ref 78.0–100.0)
MONO ABS: 1.1 10*3/uL — AB (ref 0.1–1.0)
MONOS PCT: 4 %
NEUTROS ABS: 22.5 10*3/uL — AB (ref 1.7–7.7)
Neutrophils Relative %: 90 %
Platelets: 437 10*3/uL — ABNORMAL HIGH (ref 150–400)
RBC: 4.06 MIL/uL — ABNORMAL LOW (ref 4.22–5.81)
RDW: 15.2 % (ref 11.5–15.5)
WBC: 25 10*3/uL — ABNORMAL HIGH (ref 4.0–10.5)

## 2017-03-26 LAB — URINALYSIS, ROUTINE W REFLEX MICROSCOPIC
Bacteria, UA: NONE SEEN
Bilirubin Urine: NEGATIVE
Hgb urine dipstick: NEGATIVE
KETONES UR: 20 mg/dL — AB
Leukocytes, UA: NEGATIVE
Nitrite: NEGATIVE
PH: 5 (ref 5.0–8.0)
Protein, ur: 100 mg/dL — AB
Specific Gravity, Urine: 1.02 (ref 1.005–1.030)

## 2017-03-26 NOTE — Discharge Instructions (Signed)
Chest x-ray showed no pneumonia.  Urinalysis was clean.  Follow-up with your primary care doctor or return if worse.
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
## 2017-03-28 ENCOUNTER — Emergency Department (HOSPITAL_COMMUNITY): Payer: Medicare Other

## 2017-03-28 ENCOUNTER — Other Ambulatory Visit: Payer: Self-pay

## 2017-03-28 ENCOUNTER — Inpatient Hospital Stay (HOSPITAL_COMMUNITY)
Admission: EM | Admit: 2017-03-28 | Discharge: 2017-04-02 | DRG: 640 | Disposition: A | Discharge status: Hospice | Payer: Medicare Other | Attending: Family Medicine | Admitting: Family Medicine

## 2017-03-28 ENCOUNTER — Encounter (HOSPITAL_COMMUNITY): Payer: Self-pay

## 2017-03-28 DIAGNOSIS — E860 Dehydration: Secondary | ICD-10-CM | POA: Diagnosis present

## 2017-03-28 DIAGNOSIS — R1311 Dysphagia, oral phase: Secondary | ICD-10-CM | POA: Diagnosis present

## 2017-03-28 DIAGNOSIS — L89152 Pressure ulcer of sacral region, stage 2: Secondary | ICD-10-CM | POA: Diagnosis present

## 2017-03-28 DIAGNOSIS — E1149 Type 2 diabetes mellitus with other diabetic neurological complication: Secondary | ICD-10-CM

## 2017-03-28 DIAGNOSIS — J45909 Unspecified asthma, uncomplicated: Secondary | ICD-10-CM | POA: Diagnosis present

## 2017-03-28 DIAGNOSIS — Z888 Allergy status to other drugs, medicaments and biological substances status: Secondary | ICD-10-CM | POA: Diagnosis not present

## 2017-03-28 DIAGNOSIS — E11621 Type 2 diabetes mellitus with foot ulcer: Secondary | ICD-10-CM | POA: Diagnosis present

## 2017-03-28 DIAGNOSIS — R627 Adult failure to thrive: Secondary | ICD-10-CM | POA: Diagnosis not present

## 2017-03-28 DIAGNOSIS — I69354 Hemiplegia and hemiparesis following cerebral infarction affecting left non-dominant side: Secondary | ICD-10-CM

## 2017-03-28 DIAGNOSIS — E119 Type 2 diabetes mellitus without complications: Secondary | ICD-10-CM

## 2017-03-28 DIAGNOSIS — L98429 Non-pressure chronic ulcer of back with unspecified severity: Secondary | ICD-10-CM | POA: Diagnosis not present

## 2017-03-28 DIAGNOSIS — Z880 Allergy status to penicillin: Secondary | ICD-10-CM | POA: Diagnosis not present

## 2017-03-28 DIAGNOSIS — G9341 Metabolic encephalopathy: Secondary | ICD-10-CM | POA: Diagnosis present

## 2017-03-28 DIAGNOSIS — E870 Hyperosmolality and hypernatremia: Secondary | ICD-10-CM | POA: Diagnosis present

## 2017-03-28 DIAGNOSIS — I10 Essential (primary) hypertension: Secondary | ICD-10-CM | POA: Diagnosis present

## 2017-03-28 DIAGNOSIS — Z79899 Other long term (current) drug therapy: Secondary | ICD-10-CM

## 2017-03-28 DIAGNOSIS — Z833 Family history of diabetes mellitus: Secondary | ICD-10-CM | POA: Diagnosis not present

## 2017-03-28 DIAGNOSIS — I69320 Aphasia following cerebral infarction: Secondary | ICD-10-CM

## 2017-03-28 DIAGNOSIS — Z7984 Long term (current) use of oral hypoglycemic drugs: Secondary | ICD-10-CM

## 2017-03-28 DIAGNOSIS — R1313 Dysphagia, pharyngeal phase: Secondary | ICD-10-CM | POA: Diagnosis present

## 2017-03-28 DIAGNOSIS — L97419 Non-pressure chronic ulcer of right heel and midfoot with unspecified severity: Secondary | ICD-10-CM | POA: Diagnosis present

## 2017-03-28 DIAGNOSIS — Z66 Do not resuscitate: Secondary | ICD-10-CM | POA: Diagnosis present

## 2017-03-28 DIAGNOSIS — B370 Candidal stomatitis: Secondary | ICD-10-CM | POA: Diagnosis present

## 2017-03-28 DIAGNOSIS — E1165 Type 2 diabetes mellitus with hyperglycemia: Secondary | ICD-10-CM | POA: Diagnosis present

## 2017-03-28 DIAGNOSIS — Z7189 Other specified counseling: Secondary | ICD-10-CM

## 2017-03-28 DIAGNOSIS — E1142 Type 2 diabetes mellitus with diabetic polyneuropathy: Secondary | ICD-10-CM | POA: Diagnosis present

## 2017-03-28 DIAGNOSIS — E876 Hypokalemia: Secondary | ICD-10-CM | POA: Diagnosis present

## 2017-03-28 DIAGNOSIS — Z8249 Family history of ischemic heart disease and other diseases of the circulatory system: Secondary | ICD-10-CM

## 2017-03-28 DIAGNOSIS — Z87891 Personal history of nicotine dependence: Secondary | ICD-10-CM

## 2017-03-28 DIAGNOSIS — Z515 Encounter for palliative care: Secondary | ICD-10-CM | POA: Diagnosis not present

## 2017-03-28 DIAGNOSIS — L89611 Pressure ulcer of right heel, stage 1: Secondary | ICD-10-CM | POA: Diagnosis not present

## 2017-03-28 DIAGNOSIS — L89601 Pressure ulcer of unspecified heel, stage 1: Secondary | ICD-10-CM

## 2017-03-28 DIAGNOSIS — R748 Abnormal levels of other serum enzymes: Secondary | ICD-10-CM | POA: Diagnosis not present

## 2017-03-28 DIAGNOSIS — Z823 Family history of stroke: Secondary | ICD-10-CM

## 2017-03-28 DIAGNOSIS — R350 Frequency of micturition: Secondary | ICD-10-CM | POA: Diagnosis present

## 2017-03-28 DIAGNOSIS — F329 Major depressive disorder, single episode, unspecified: Secondary | ICD-10-CM | POA: Diagnosis present

## 2017-03-28 DIAGNOSIS — Z7901 Long term (current) use of anticoagulants: Secondary | ICD-10-CM

## 2017-03-28 DIAGNOSIS — R1319 Other dysphagia: Secondary | ICD-10-CM | POA: Diagnosis present

## 2017-03-28 DIAGNOSIS — R778 Other specified abnormalities of plasma proteins: Secondary | ICD-10-CM

## 2017-03-28 DIAGNOSIS — R7989 Other specified abnormal findings of blood chemistry: Secondary | ICD-10-CM

## 2017-03-28 DIAGNOSIS — Z792 Long term (current) use of antibiotics: Secondary | ICD-10-CM

## 2017-03-28 LAB — URINALYSIS, ROUTINE W REFLEX MICROSCOPIC
Bilirubin Urine: NEGATIVE
Glucose, UA: >=500 mg/dL — AB
Hgb urine dipstick: NEGATIVE
Ketones, ur: 20 mg/dL — AB
Leukocytes, UA: NEGATIVE
Nitrite: NEGATIVE
PH: 5 (ref 5.0–8.0)
Protein, ur: 30 mg/dL — AB
SPECIFIC GRAVITY, URINE: 1.018 (ref 1.005–1.030)

## 2017-03-28 LAB — CBC WITH DIFFERENTIAL/PLATELET
Basophils Absolute: 0 10*3/uL (ref 0.0–0.1)
Basophils Relative: 0 %
EOS ABS: 0 10*3/uL (ref 0.0–0.7)
EOS PCT: 0 %
HCT: 35.5 % — ABNORMAL LOW (ref 39.0–52.0)
Hemoglobin: 11 g/dL — ABNORMAL LOW (ref 13.0–17.0)
LYMPHS ABS: 2.1 10*3/uL (ref 0.7–4.0)
Lymphocytes Relative: 13 %
MCH: 29.3 pg (ref 26.0–34.0)
MCHC: 31 g/dL (ref 30.0–36.0)
MCV: 94.4 fL (ref 78.0–100.0)
MONO ABS: 1 10*3/uL (ref 0.1–1.0)
Monocytes Relative: 7 %
Neutro Abs: 12.3 10*3/uL — ABNORMAL HIGH (ref 1.7–7.7)
Neutrophils Relative %: 80 %
PLATELETS: 469 10*3/uL — AB (ref 150–400)
RBC: 3.76 MIL/uL — AB (ref 4.22–5.81)
RDW: 15.5 % (ref 11.5–15.5)
WBC: 15.4 10*3/uL — AB (ref 4.0–10.5)

## 2017-03-28 LAB — COMPREHENSIVE METABOLIC PANEL
ALK PHOS: 88 U/L (ref 38–126)
ALT: 8 U/L — AB (ref 17–63)
AST: 11 U/L — ABNORMAL LOW (ref 15–41)
Albumin: 2.8 g/dL — ABNORMAL LOW (ref 3.5–5.0)
Anion gap: 16 — ABNORMAL HIGH (ref 5–15)
BUN: 23 mg/dL — AB (ref 6–20)
CHLORIDE: 116 mmol/L — AB (ref 101–111)
CO2: 23 mmol/L (ref 22–32)
Calcium: 8.3 mg/dL — ABNORMAL LOW (ref 8.9–10.3)
Creatinine, Ser: 1.58 mg/dL — ABNORMAL HIGH (ref 0.61–1.24)
GFR calc Af Amer: 50 mL/min — ABNORMAL LOW (ref >60)
GFR, EST NON AFRICAN AMERICAN: 43 mL/min — AB (ref >60)
GLUCOSE: 174 mg/dL — AB (ref 65–99)
POTASSIUM: 3 mmol/L — AB (ref 3.5–5.1)
Sodium: 155 mmol/L — ABNORMAL HIGH (ref 135–145)
Total Bilirubin: 1.3 mg/dL — ABNORMAL HIGH (ref 0.3–1.2)
Total Protein: 6.4 g/dL — ABNORMAL LOW (ref 6.5–8.1)

## 2017-03-28 LAB — MAGNESIUM: Magnesium: 1.8 mg/dL (ref 1.7–2.4)

## 2017-03-28 LAB — LIPASE, BLOOD: Lipase: 30 U/L (ref 11–51)

## 2017-03-28 LAB — LACTIC ACID, PLASMA
LACTIC ACID, VENOUS: 1.2 mmol/L (ref 0.5–1.9)
Lactic Acid, Venous: 1.1 mmol/L (ref 0.5–1.9)

## 2017-03-28 LAB — TROPONIN I: Troponin I: 0.04 ng/mL (ref <0.03)

## 2017-03-28 MED ORDER — SODIUM CHLORIDE 0.9 % IV BOLUS (SEPSIS)
500.0000 mL | Freq: Once | INTRAVENOUS | Status: AC
  Administered 2017-03-28: 500 mL via INTRAVENOUS

## 2017-03-28 MED ORDER — ACETAMINOPHEN 325 MG PO TABS: 650.0000 mg | ORAL_TABLET | Freq: Four times a day (QID) | ORAL | Status: DC | PRN

## 2017-03-28 MED ORDER — POTASSIUM CHLORIDE 10 MEQ/100ML IV SOLN
10.0000 meq | Freq: Once | INTRAVENOUS | Status: AC
  Administered 2017-03-28: 10 meq via INTRAVENOUS
  Filled 2017-03-28: qty 100

## 2017-03-28 MED ORDER — SODIUM CHLORIDE 0.9 % IV SOLN
INTRAVENOUS | Status: DC
  Administered 2017-03-28 (×2): via INTRAVENOUS

## 2017-03-28 MED ORDER — APIXABAN 2.5 MG PO TABS
2.5000 mg | ORAL_TABLET | Freq: Two times a day (BID) | ORAL | Status: DC
  Filled 2017-03-28: qty 1

## 2017-03-28 MED ORDER — POTASSIUM CL IN DEXTROSE 5% 20 MEQ/L IV SOLN
20.0000 meq | INTRAVENOUS | Status: DC
  Filled 2017-03-28 (×3): qty 1000

## 2017-03-28 MED ORDER — FLUCONAZOLE 100MG IVPB
100.0000 mg | INTRAVENOUS | Status: DC
  Administered 2017-03-30 – 2017-04-01 (×4): 100 mg via INTRAVENOUS
  Filled 2017-03-28 (×5): qty 50

## 2017-03-28 MED ORDER — INSULIN ASPART 100 UNIT/ML ~~LOC~~ SOLN
0.0000 [IU] | Freq: Four times a day (QID) | SUBCUTANEOUS | Status: DC
  Administered 2017-03-29: 3 [IU] via SUBCUTANEOUS
  Administered 2017-03-29: 2 [IU] via SUBCUTANEOUS
  Administered 2017-03-29: 1 [IU] via SUBCUTANEOUS
  Administered 2017-03-29 – 2017-03-30 (×3): 2 [IU] via SUBCUTANEOUS

## 2017-03-28 MED ORDER — DEXTROSE 5 % IV SOLN
INTRAVENOUS | Status: DC
  Administered 2017-03-29 – 2017-03-31 (×5): via INTRAVENOUS

## 2017-03-28 MED ORDER — FLUCONAZOLE IN SODIUM CHLORIDE 200-0.9 MG/100ML-% IV SOLN
200.0000 mg | Freq: Once | INTRAVENOUS | Status: AC
  Administered 2017-03-29: 200 mg via INTRAVENOUS
  Filled 2017-03-28 (×2): qty 100

## 2017-03-28 MED ORDER — ACETAMINOPHEN 650 MG RE SUPP: 650.0000 mg | Freq: Four times a day (QID) | RECTAL | Status: DC | PRN

## 2017-03-28 MED ORDER — POTASSIUM CHLORIDE 10 MEQ/100ML IV SOLN
10.0000 meq | INTRAVENOUS | Status: AC
  Administered 2017-03-29 (×4): 10 meq via INTRAVENOUS
  Filled 2017-03-28 (×3): qty 100

## 2017-03-28 MED ORDER — AMLODIPINE BESYLATE 5 MG PO TABS
5.0000 mg | ORAL_TABLET | Freq: Every day | ORAL | Status: DC
  Administered 2017-03-31 – 2017-04-02 (×3): 5 mg via ORAL
  Filled 2017-03-28 (×4): qty 1

## 2017-03-28 NOTE — ED Notes (Signed)
Girlfriend reports that pt has 24 hr sitters and sitter reports pt has had difficulty swallowing and holding liquids in mouth.
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
## 2017-03-28 NOTE — Progress Notes (Signed)
Admitted to floor around 1730.  Patient mostly nonverbal but when tries talk is unintelligble.  Girlfriend present and says that she can usually understand him but not in the last few days. Stage 2 on sacrum and protective dressing applied.  Stage one on right heel and protective dressing applied. White patches on tongue and mucus membrane.  Red scrotum.  Chronic foley. Girlfriend says he lives at home with 24 /7 sitters/cnas.

## 2017-03-28 NOTE — ED Notes (Signed)
CRITICAL VALUE ALERT  Critical Value: troponin 0.04 Date & Time Notied: 5009  03/28/17  Provider Notified: [email or web address]  Orders Received/Actions taken:
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
## 2017-03-29 ENCOUNTER — Encounter (HOSPITAL_COMMUNITY): Payer: Self-pay | Admitting: Primary Care

## 2017-03-29 LAB — BASIC METABOLIC PANEL
ANION GAP: 12 (ref 5–15)
BUN: 21 mg/dL — ABNORMAL HIGH (ref 6–20)
CO2: 22 mmol/L (ref 22–32)
Calcium: 8.2 mg/dL — ABNORMAL LOW (ref 8.9–10.3)
Chloride: 122 mmol/L — ABNORMAL HIGH (ref 101–111)
Creatinine, Ser: 1.32 mg/dL — ABNORMAL HIGH (ref 0.61–1.24)
GFR calc non Af Amer: 54 mL/min — ABNORMAL LOW (ref >60)
GLUCOSE: 180 mg/dL — AB (ref 65–99)
Potassium: 3.7 mmol/L (ref 3.5–5.1)
Sodium: 156 mmol/L — ABNORMAL HIGH (ref 135–145)

## 2017-03-29 LAB — TROPONIN I
TROPONIN I: 0.03 ng/mL — AB (ref <0.03)
Troponin I: 0.03 ng/mL (ref <0.03)

## 2017-03-29 LAB — CBC
HCT: 38 % — ABNORMAL LOW (ref 39.0–52.0)
HEMOGLOBIN: 11.4 g/dL — AB (ref 13.0–17.0)
MCH: 28.9 pg (ref 26.0–34.0)
MCHC: 30 g/dL (ref 30.0–36.0)
MCV: 96.2 fL (ref 78.0–100.0)
Platelets: 450 10*3/uL — ABNORMAL HIGH (ref 150–400)
RBC: 3.95 MIL/uL — ABNORMAL LOW (ref 4.22–5.81)
RDW: 15.6 % — ABNORMAL HIGH (ref 11.5–15.5)
WBC: 12.4 10*3/uL — ABNORMAL HIGH (ref 4.0–10.5)

## 2017-03-29 LAB — MAGNESIUM: MAGNESIUM: 1.8 mg/dL (ref 1.7–2.4)

## 2017-03-29 LAB — GLUCOSE, CAPILLARY
GLUCOSE-CAPILLARY: 211 mg/dL — AB (ref 65–99)
Glucose-Capillary: 141 mg/dL — ABNORMAL HIGH (ref 65–99)
Glucose-Capillary: 171 mg/dL — ABNORMAL HIGH (ref 65–99)
Glucose-Capillary: 181 mg/dL — ABNORMAL HIGH (ref 65–99)

## 2017-03-29 MED ORDER — ORAL CARE MOUTH RINSE
15.0000 mL | Freq: Two times a day (BID) | OROMUCOSAL | Status: DC
  Administered 2017-03-30 – 2017-04-02 (×8): 15 mL via OROMUCOSAL

## 2017-03-29 MED ORDER — RESOURCE THICKENUP CLEAR PO POWD
ORAL | Status: DC | PRN
  Filled 2017-03-29: qty 125

## 2017-03-29 MED ORDER — HYDRALAZINE HCL 20 MG/ML IJ SOLN: 5.0000 mg | INTRAMUSCULAR | Status: DC | PRN

## 2017-03-29 MED ORDER — CHLORHEXIDINE GLUCONATE 0.12 % MT SOLN
15.0000 mL | Freq: Two times a day (BID) | OROMUCOSAL | Status: DC
  Administered 2017-03-29 – 2017-04-02 (×8): 15 mL via OROMUCOSAL
  Filled 2017-03-29 (×7): qty 15
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
## 2017-03-29 NOTE — Progress Notes (Signed)
Speech Pathology  Acknowledge consult for BSE. Pt known to SLP service from recent acute admission and had MBSS completed on 03/18/17 with recommendation for puree and NTL due to oral phase deficits (pt holding liquids orally) with suspected cognitive based component. SLP coverage later this PM and will see Pt. Palliative care consult also recommended to help establish goals of care.  Thank you,  Genene Churn, CCC-SLP [phone]

## 2017-03-29 NOTE — Plan of Care (Signed)
Rick Welch is resting quietly in bed. He is able to make and briefly keep eye contact.  He has a history of stroke and I am unable to understand him.  There is no family at bedside. Unable to reach Florien, [employer]. Will attempt to set up family meeting for Monday 1/21.  No charge

## 2017-03-29 NOTE — Progress Notes (Addendum)
PROGRESS NOTE    Rick Welch  TGY:[id number] DOB: [birth year] DOA: 03/28/2017 PCP: Medicine, [facility]   Brief Narrative:  This is a 67 year old male with prior history of CVA and residual a aphasia, dysphagia, and right upper extremity weakness who presented with altered mentation as well as difficulty swallowing and holding liquids in his mouth.  He has been admitted for dehydration and dysphagia.  He is noted to have oral candidiasis for which he has been started on IV Diflucan with a speech therapy consultation pending.  He has been recommended a pured diet earlier this month.  He has been started on D5 IV fluid for his dehydration.  CT scan of the head did not demonstrate any acute findings.   Assessment & Plan:   Principal Problem:   Dehydration Active Problems:   Diabetes (HCC)   Elevated troponin   Stage 2 skin ulcer of sacral region (HCC)   Decubitus ulcer of heel, stage 1   Dehydration secondary to poor oral intake, dysphagia, odynophagia, oral candidiasis  -Continue n.p.o. -Continue IV fluid with D5 water - Await speech therapy evaluation -Continue IV Diflucan -No need for brain MRI as CVA is not suspected and patient does take Eliquis at home  Elevated troponin - flat with no suspicion of ACS  Chronic indwelling foley catheter. No evidence of infeciton - continue foley  Stage 2 sacral pressure ulcer, stage I on right heel, both present on admission - wound care  DM type 2. Hold oral meds.  - SSI  Prior CVA with neurological sequelae -Continue Eliquis -Poor overall prognosis; will consult palliative medicine to discuss goals of care as guardian wants to be aggressive and even consider a PEG tube which appears inappropriate    DVT prophylaxis: Eliquis Code Status: Full Family Communication: Guardian (GF) at bedside Disposition Plan: Home with 24 hour care after ST evaluation   Consultants:   ST  Procedures:    None  Antimicrobials:   Diflucan 1/17->   Subjective: Patient seen and evaluated today with no new acute complaints or concerns.  No specific history could be obtained from the patient, but girlfriend who is the guardian is at the bedside and denies any specific issues.  Eliquis as well as amlodipine have been withheld at this time on account of n.p.o. status.  Objective: Vitals:   03/28/17 1814 03/28/17 2048 03/28/17 2104 03/29/17 0457  BP: (!) 152/64 132/64  122/78  Pulse: 79 78  74  Resp: 18 18  18   Temp: 98.3 F (36.8 C) 98.4 F (36.9 C)  98.6 F (37 C)  TempSrc:  Oral  Oral  SpO2: 99% 98% 95% 97%  Weight: 64.4 kg (142 lb 0 oz)     Height: 5\' 10"  (1.778 m)       Intake/Output Summary (Last 24 hours) at 03/29/2017 1121 Last data filed at 03/29/2017 0458 Gross per 24 hour  Intake 1000 ml  Output 800 ml  Net 200 ml   Filed Weights   03/28/17 1422 03/28/17 1814  Weight: 64.4 kg (142 lb) 64.4 kg (142 lb 0 oz)    Examination:  General exam: Appears calm and comfortable ; unresponsive Respiratory system: Clear to auscultation. Respiratory effort normal. Cardiovascular system: S1 & S2 heard, RRR. No JVD, murmurs, rubs, gallops or clicks. No pedal edema. Gastrointestinal system: Abdomen is nondistended, soft and nontender. No organomegaly or masses felt. Normal bowel sounds heard. Central nervous system: Alert and oriented. No focal neurological deficits. Extremities: Symmetric  5 x 5 power. Skin: No rashes, lesions or ulcers    Data Reviewed: I have personally reviewed following labs and imaging studies  CBC: Recent Labs  Lab 03/26/17 1133 03/28/17 1448 03/29/17 0407  WBC 25.0* 15.4* 12.4*  NEUTROABS 22.5* 12.3*  --   HGB 11.8* 11.0* 11.4*  HCT 37.5* 35.5* 38.0*  MCV 92.4 94.4 96.2  PLT 437* 469* 160*   Basic Metabolic Panel: Recent Labs  Lab 03/26/17 1133 03/28/17 1448 03/28/17 1702 03/29/17 0407  NA 143 155*  --  156*  K 3.1* 3.0*  --  3.7  CL  104 116*  --  122*  CO2 23 23  --  22  GLUCOSE 209* 174*  --  180*  BUN 15 23*  --  21*  CREATININE 1.48* 1.58*  --  1.32*  CALCIUM 8.7* 8.3*  --  8.2*  MG  --   --  1.8 1.8   GFR: Estimated Creatinine Clearance: 48.8 mL/min (A) (by C-G formula based on SCr of 1.32 mg/dL (H)). Liver Function Tests: Recent Labs  Lab 03/26/17 1133 03/28/17 1448  AST 13* 11*  ALT 8* 8*  ALKPHOS 95 88  BILITOT 1.3* 1.3*  PROT 6.6 6.4*  ALBUMIN 3.0* 2.8*   Recent Labs  Lab 03/28/17 1448  LIPASE 30   No results for input(s): AMMONIA in the last 168 hours. Coagulation Profile: No results for input(s): INR, PROTIME in the last 168 hours. Cardiac Enzymes: Recent Labs  Lab 03/28/17 1448 03/28/17 2257 03/29/17 0407 03/29/17 0934  TROPONINI 0.04* 0.03* 0.03* <0.03   BNP (last 3 results) No results for input(s): PROBNP in the last 8760 hours. HbA1C: No results for input(s): HGBA1C in the last 72 hours. CBG: Recent Labs  Lab 03/29/17 0105 03/29/17 0537  GLUCAP 141* 171*   Lipid Profile: No results for input(s): CHOL, HDL, LDLCALC, TRIG, CHOLHDL, LDLDIRECT in the last 72 hours. Thyroid Function Tests: No results for input(s): TSH, T4TOTAL, FREET4, T3FREE, THYROIDAB in the last 72 hours. Anemia Panel: No results for input(s): VITAMINB12, FOLATE, FERRITIN, TIBC, IRON, RETICCTPCT in the last 72 hours. Sepsis Labs: Recent Labs  Lab 03/28/17 1448 03/28/17 1615  LATICACIDVEN 1.2 1.1    No results found for this or any previous visit (from the past 240 hour(s)).       Radiology Studies: Ct Head Wo Contrast  Result Date: 03/28/2017 CLINICAL DATA:  Altered mental status EXAM: CT HEAD WITHOUT CONTRAST TECHNIQUE: Contiguous axial images were obtained from the base of the skull through the vertex without intravenous contrast. COMPARISON:  February 15, 2017 head CT and brain MRI August 23, 2016 FINDINGS: Brain: There is age related volume loss. There is no intracranial mass, hemorrhage,  extra-axial fluid collection, or midline shift. There is extensive decreased attenuation throughout the periventricular white matter with multiple presumed infarcts throughout the centra semiovale, more severe on the right than on the left. There is decreased attenuation in both thalamus regions as well as in the genu and posterior limb of the left internal capsule and in the posterior right lentiform nucleus, stable. There is small vessel disease throughout much of the pons in the basilar perforator distribution. No acute appearing infarct is evident on this study. Vascular: No hyperdense vessel evident. There is calcification in each carotid siphon. Skull: Bony calvarium appears intact. Sinuses/Orbits: Paranasal sinuses are clear. Orbits appear symmetric bilaterally. Other: Mastoid air cells which are visualized are clear. IMPRESSION: 1.Age related volume loss. Extensive decreased attenuation in the periventricular  white matter, both basal ganglia regions, both thalamus regions as well as in the mid pons. Suspect small vessel disease with small infarcts in these areas. Demyelination is a differential consideration given this appearance. Both entities may exist concurrently. The appearance is not appreciably changed compared to prior CT. 2.  No mass or hemorrhage. 3.  There are foci of arterial vascular calcification. Electronically Signed   By: Lowella Grip III M.D.   On: 03/28/2017 15:51   Dg Chest Port 1 View  Result Date: 03/28/2017 CLINICAL DATA:  Altered mental status. EXAM: PORTABLE CHEST 1 VIEW COMPARISON:  03/26/2017 FINDINGS: 1437 hours. Low lung volumes with asymmetric elevation right hemidiaphragm. The cardiopericardial silhouette is within normal limits for size. Scarring in the right mid lung is similar to prior. Vascular congestion evident without overt pulmonary edema. No substantial pleural effusion. The visualized bony structures of the thorax are intact. Telemetry leads overlie the  chest. IMPRESSION: Low volume film without acute cardiopulmonary findings. Electronically Signed   By: Misty Stanley M.D.   On: 03/28/2017 14:57        Scheduled Meds: . amLODipine  5 mg Oral Daily  . apixaban  2.5 mg Oral BID  . insulin aspart  0-9 Units Subcutaneous Q6H   Continuous Infusions: . dextrose 125 mL/hr at 03/29/17 1042  . fluconazole (DIFLUCAN) IV       LOS: 1 day    Time spent: 30 minutes    Oasis Goehring Darleen Crocker, DO [facility] Pager [phone]  If 7PM-7AM, please contact night-coverage [email or web address] Password [facility] 03/29/2017, 11:21 AM

## 2017-03-29 NOTE — Progress Notes (Signed)
Initial Nutrition Assessment  DOCUMENTATION CODES:      INTERVENTION:  Add oral nutrition supplement, if/ when pt is cleared for diet advancement   NUTRITION DIAGNOSIS:   Inadequate oral intake related to mouth pain, dysphagia, wound healing as evidenced by percent weight loss, NPO status.   GOAL:   Patient will meet greater than or equal to 90% of their needs   MONITOR:   PO intake, Supplement acceptance, Skin, Labs, Weight trends  REASON FOR ASSESSMENT:   Malnutrition Screening Tool    ASSESSMENT: the patient presents with dehydration and dysphagia. ST and palliative care consulted. RD unable to distinguish what the patient is trying to communicate due to aphgia. Chart reviewed. No family present. Hx of stroke, DM, Peripheral neuropathy and skin breakdown. His home diet is Pureed but apparently he has not been eating due to his dehydrated state on admission and significant wt loss.  His weight is down 15% <6 months which is severe unplanned loss.   Recent Labs  Lab 03/26/17 1133 03/28/17 1448 03/28/17 1702 03/29/17 0407  NA 143 155*  --  156*  K 3.1* 3.0*  --  3.7  CL 104 116*  --  122*  CO2 23 23  --  22  BUN 15 23*  --  21*  CREATININE 1.48* 1.58*  --  1.32*  CALCIUM 8.7* 8.3*  --  8.2*  MG  --   --  1.8 1.8  GLUCOSE 209* 174*  --  180*    NUTRITION - FOCUSED PHYSICAL EXAM: Unable to complete presently - expect pt is malnourished due to his wt loss and dehydrated state on admission but unable to diagnosis at this time. RD will continue to follow.   Diet Order:  Diet NPO time specified  EDUCATION NEEDS:   No education needs have been identified at this time  Skin:  Skin Assessment: Skin Integrity Issues: Skin Integrity Issues:: Stage II, Stage I Stage I: heel Stage II: sacrum  Last BM:  unknown  Height:   Ht Readings from Last 1 Encounters:  03/28/17 5\' 10"  (1.778 m)    Weight:   Wt Readings from Last 1 Encounters:  03/28/17 142 lb 0 oz (64.4  kg)    Ideal Body Weight:  75 kg  BMI:  Body mass index is 20.37 kg/m.  Estimated Nutritional Needs:   Kcal:  1937-9024   Protein:  84-90 gr   Fluid:  1.9-2.0 liters daily  Colman Cater MS,RD,CSG,LDN Office: [phone] Pager: [phone]

## 2017-03-30 LAB — CBC
HCT: 34.8 % — ABNORMAL LOW (ref 39.0–52.0)
HEMOGLOBIN: 10.6 g/dL — AB (ref 13.0–17.0)
MCH: 28.9 pg (ref 26.0–34.0)
MCHC: 30.5 g/dL (ref 30.0–36.0)
MCV: 94.8 fL (ref 78.0–100.0)
Platelets: 411 10*3/uL — ABNORMAL HIGH (ref 150–400)
RBC: 3.67 MIL/uL — AB (ref 4.22–5.81)
RDW: 15.5 % (ref 11.5–15.5)
WBC: 8.7 10*3/uL (ref 4.0–10.5)

## 2017-03-30 LAB — BASIC METABOLIC PANEL
Anion gap: 10 (ref 5–15)
BUN: 12 mg/dL (ref 6–20)
CHLORIDE: 105 mmol/L (ref 101–111)
CO2: 24 mmol/L (ref 22–32)
Calcium: 7.5 mg/dL — ABNORMAL LOW (ref 8.9–10.3)
Creatinine, Ser: 0.91 mg/dL (ref 0.61–1.24)
GFR calc Af Amer: >60 mL/min (ref >60)
GFR calc non Af Amer: >60 mL/min (ref >60)
GLUCOSE: 395 mg/dL — AB (ref 65–99)
POTASSIUM: 2.7 mmol/L — AB (ref 3.5–5.1)
Sodium: 139 mmol/L (ref 135–145)

## 2017-03-30 LAB — GLUCOSE, CAPILLARY
GLUCOSE-CAPILLARY: 107 mg/dL — AB (ref 65–99)
GLUCOSE-CAPILLARY: 147 mg/dL — AB (ref 65–99)
GLUCOSE-CAPILLARY: 163 mg/dL — AB (ref 65–99)
GLUCOSE-CAPILLARY: 199 mg/dL — AB (ref 65–99)
Glucose-Capillary: 131 mg/dL — ABNORMAL HIGH (ref 65–99)
Glucose-Capillary: 170 mg/dL — ABNORMAL HIGH (ref 65–99)
Glucose-Capillary: 174 mg/dL — ABNORMAL HIGH (ref 65–99)
Glucose-Capillary: 212 mg/dL — ABNORMAL HIGH (ref 65–99)

## 2017-03-30 LAB — URINE CULTURE: CULTURE: NO GROWTH

## 2017-03-30 LAB — MAGNESIUM: Magnesium: 1.5 mg/dL — ABNORMAL LOW (ref 1.7–2.4)

## 2017-03-30 MED ORDER — POTASSIUM CHLORIDE 10 MEQ/100ML IV SOLN
10.0000 meq | INTRAVENOUS | Status: AC
  Administered 2017-03-30 (×6): 10 meq via INTRAVENOUS
  Filled 2017-03-30 (×6): qty 100

## 2017-03-30 MED ORDER — PHENOL 1.4 % MT LIQD
1.0000 | OROMUCOSAL | Status: DC | PRN
  Filled 2017-03-30: qty 177

## 2017-03-30 MED ORDER — ENOXAPARIN SODIUM 40 MG/0.4ML ~~LOC~~ SOLN
40.0000 mg | SUBCUTANEOUS | Status: DC
  Administered 2017-03-30 – 2017-04-02 (×4): 40 mg via SUBCUTANEOUS
  Filled 2017-03-30 (×4): qty 0.4

## 2017-03-30 MED ORDER — INSULIN ASPART 100 UNIT/ML ~~LOC~~ SOLN
0.0000 [IU] | SUBCUTANEOUS | Status: DC
  Administered 2017-03-30: 4 [IU] via SUBCUTANEOUS
  Administered 2017-03-30: 3 [IU] via SUBCUTANEOUS
  Administered 2017-03-31 (×2): 4 [IU] via SUBCUTANEOUS
  Administered 2017-03-31 – 2017-04-01 (×3): 3 [IU] via SUBCUTANEOUS

## 2017-03-30 MED ORDER — PHENOL 1.4 % MT LIQD
1.0000 | OROMUCOSAL | Status: DC | PRN
  Administered 2017-03-30: 1 via OROMUCOSAL
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
## 2017-03-30 NOTE — Progress Notes (Signed)
CRITICAL VALUE ALERT  Critical Value:  Potassium 2.7   Date & Time Notied:  03/30/2017 0850  Provider Notified: Dr. Manuella Ghazi notified verbally  Orders Received/Actions taken: Dr. Manuella Ghazi to order potassium runs

## 2017-03-30 NOTE — Progress Notes (Signed)
PROGRESS NOTE    Rick Welch  OVF:[id number] DOB: 1950-06-20 DOA: 03/28/2017 PCP: Medicine, [facility]   Brief Narrative:  This is a 67 year old male with prior history of CVA and residual a aphasia, dysphagia, and right upper extremity weakness who presented with altered mentation as well as difficulty swallowing and holding liquids in his mouth.  He has been admitted for dehydration and dysphagia.  He is noted to have oral candidiasis for which he has been started on IV Diflucan with a speech therapy consultation recommending nectar thickened liquids.  He has been recommended a pured diet earlier this month.  He has been started on D5 IV fluid for his dehydration and is now hyperglycemic.  CT scan of the head did not demonstrate any acute findings, but he appears to be more altered this am.   Assessment & Plan:   Principal Problem:   Dehydration Active Problems:   Diabetes (HCC)   Elevated troponin   Stage 2 skin ulcer of sacral region (HCC)   Decubitus ulcer of heel, stage 1   Dehydration secondary to poor oral intake, dysphagia, odynophagia, oral candidiasis  -Continue on nectar thick as tolerated -Continue IV fluid with D5 water at 75cc/hr - Appreciate further speech evaluation to see if improvement with Diflucan -Continue IV Diflucan -Holding oral meds  Hypokalemia -Replete with IV Potassium -Check Mg -AM BMP -Tele monitoring through today  Hyperglycemia -Increase SSI -Decrease D5W  Worsening metabolic encephalopathy likely due to hyperglycemia and hypokalemia -Correct and monitor -May repeat head CT this weekend if needed; otherwise MRI by Monday  Elevated troponin - flat with no suspicion of ACS  Chronic indwelling foley catheter. No evidence of infeciton - continue foley  Stage 2 sacral pressure ulcer, stage I on right heel, both present on admission - wound care  DM type 2. Hold oral meds.  - SSI Increased.  Prior CVA with  neurological sequelae -Holding Eliquis due to poor swallow capacity; transition to Lovenox -Poor overall prognosis; will consult palliative medicine to discuss goals of care as guardian wants to be aggressive and even consider a PEG tube which appears inappropriate; attempted yesterday, but no guardian at bedside; will attempt again by Monday.    DVT prophylaxis: Lovenox Code Status: Full Family Communication: Guardian (GF) at bedside Disposition Plan: Home with 24 hour care after ST evaluation   Consultants:   ST  Procedures:   None  Antimicrobials:   Diflucan 1/17->   Subjective: Patient seen and evaluated today; no acute overnight events noted. He appears more altered this am. He is not alert enough to try and swallow medications.  Objective: Vitals:   03/29/17 0457 03/29/17 1506 03/29/17 2111 03/30/17 0623  BP: 122/78 (!) 161/73 (!) 144/77 125/66  Pulse: 74 73 68 65  Resp: 18 18 17 17   Temp: 98.6 F (37 C) 98.5 F (36.9 C) (!) 97.5 F (36.4 C) (!) 97.5 F (36.4 C)  TempSrc: Oral Oral Axillary Axillary  SpO2: 97% 100% 100% 98%  Weight:      Height:        Intake/Output Summary (Last 24 hours) at 03/30/2017 1022 Last data filed at 03/30/2017 0630 Gross per 24 hour  Intake 1000 ml  Output 1400 ml  Net -400 ml   Filed Weights   03/28/17 1422 03/28/17 1814  Weight: 64.4 kg (142 lb) 64.4 kg (142 lb 0 oz)    Examination:  General exam: Appears calm and comfortable ; unresponsive Respiratory system: Clear to  auscultation. Respiratory effort normal. Cardiovascular system: S1 & S2 heard, RRR. No JVD, murmurs, rubs, gallops or clicks. No pedal edema. Gastrointestinal system: Abdomen is nondistended, soft and nontender. No organomegaly or masses felt. Normal bowel sounds heard. Central nervous system: Alert and oriented. No focal neurological deficits. Extremities: Symmetric 5 x 5 power. Skin: No rashes, lesions or ulcers    Data Reviewed: I have personally  reviewed following labs and imaging studies  CBC: Recent Labs  Lab 03/26/17 1133 03/28/17 1448 03/29/17 0407 03/30/17 0604  WBC 25.0* 15.4* 12.4* 8.7  NEUTROABS 22.5* 12.3*  --   --   HGB 11.8* 11.0* 11.4* 10.6*  HCT 37.5* 35.5* 38.0* 34.8*  MCV 92.4 94.4 96.2 94.8  PLT 437* 469* 450* 643*   Basic Metabolic Panel: Recent Labs  Lab 03/26/17 1133 03/28/17 1448 03/28/17 1702 03/29/17 0407 03/30/17 0604  NA 143 155*  --  156* 139  K 3.1* 3.0*  --  3.7 2.7*  CL 104 116*  --  122* 105  CO2 23 23  --  22 24  GLUCOSE 209* 174*  --  180* 395*  BUN 15 23*  --  21* 12  CREATININE 1.48* 1.58*  --  1.32* 0.91  CALCIUM 8.7* 8.3*  --  8.2* 7.5*  MG  --   --  1.8 1.8  --    GFR: Estimated Creatinine Clearance: 70.8 mL/min (by C-G formula based on SCr of 0.91 mg/dL). Liver Function Tests: Recent Labs  Lab 03/26/17 1133 03/28/17 1448  AST 13* 11*  ALT 8* 8*  ALKPHOS 95 88  BILITOT 1.3* 1.3*  PROT 6.6 6.4*  ALBUMIN 3.0* 2.8*   Recent Labs  Lab 03/28/17 1448  LIPASE 30   No results for input(s): AMMONIA in the last 168 hours. Coagulation Profile: No results for input(s): INR, PROTIME in the last 168 hours. Cardiac Enzymes: Recent Labs  Lab 03/28/17 1448 03/28/17 2257 03/29/17 0407 03/29/17 0934  TROPONINI 0.04* 0.03* 0.03* <0.03   BNP (last 3 results) No results for input(s): PROBNP in the last 8760 hours. HbA1C: No results for input(s): HGBA1C in the last 72 hours. CBG: Recent Labs  Lab 03/29/17 1632 03/30/17 0046 03/30/17 0230 03/30/17 0629 03/30/17 0722  GLUCAP 181* 212* 199* 174* 163*   Lipid Profile: No results for input(s): CHOL, HDL, LDLCALC, TRIG, CHOLHDL, LDLDIRECT in the last 72 hours. Thyroid Function Tests: No results for input(s): TSH, T4TOTAL, FREET4, T3FREE, THYROIDAB in the last 72 hours. Anemia Panel: No results for input(s): VITAMINB12, FOLATE, FERRITIN, TIBC, IRON, RETICCTPCT in the last 72 hours. Sepsis Labs: Recent Labs  Lab  03/28/17 1448 03/28/17 1615  LATICACIDVEN 1.2 1.1    Recent Results (from the past 240 hour(s))  Urine culture     Status: None   Collection Time: 03/28/17  2:36 PM  Result Value Ref Range Status   Specimen Description URINE, CATHETERIZED  Final   Special Requests NONE  Final   Culture   Final    NO GROWTH Performed at [facility], [place] [address], [place], [place] [address]    Report Status 03/30/2017 FINAL  Final       Radiology Studies: Ct Head Wo Contrast  Result Date: 03/28/2017 CLINICAL DATA:  Altered mental status EXAM: CT HEAD WITHOUT CONTRAST TECHNIQUE: Contiguous axial images were obtained from the base of the skull through the vertex without intravenous contrast. COMPARISON:  February 15, 2017 head CT and brain MRI August 23, 2016 FINDINGS: Brain: There  is age related volume loss. There is no intracranial mass, hemorrhage, extra-axial fluid collection, or midline shift. There is extensive decreased attenuation throughout the periventricular white matter with multiple presumed infarcts throughout the centra semiovale, more severe on the right than on the left. There is decreased attenuation in both thalamus regions as well as in the genu and posterior limb of the left internal capsule and in the posterior right lentiform nucleus, stable. There is small vessel disease throughout much of the pons in the basilar perforator distribution. No acute appearing infarct is evident on this study. Vascular: No hyperdense vessel evident. There is calcification in each carotid siphon. Skull: Bony calvarium appears intact. Sinuses/Orbits: Paranasal sinuses are clear. Orbits appear symmetric bilaterally. Other: Mastoid air cells which are visualized are clear. IMPRESSION: 1.Age related volume loss. Extensive decreased attenuation in the periventricular white matter, both basal ganglia regions, both thalamus regions as well as in the mid pons. Suspect small vessel disease with small infarcts in  these areas. Demyelination is a differential consideration given this appearance. Both entities may exist concurrently. The appearance is not appreciably changed compared to prior CT. 2.  No mass or hemorrhage. 3.  There are foci of arterial vascular calcification. Electronically Signed   By: Lowella Grip III M.D.   On: 03/28/2017 15:51   Dg Chest Port 1 View  Result Date: 03/28/2017 CLINICAL DATA:  Altered mental status. EXAM: PORTABLE CHEST 1 VIEW COMPARISON:  03/26/2017 FINDINGS: 1437 hours. Low lung volumes with asymmetric elevation right hemidiaphragm. The cardiopericardial silhouette is within normal limits for size. Scarring in the right mid lung is similar to prior. Vascular congestion evident without overt pulmonary edema. No substantial pleural effusion. The visualized bony structures of the thorax are intact. Telemetry leads overlie the chest. IMPRESSION: Low volume film without acute cardiopulmonary findings. Electronically Signed   By: Misty Stanley M.D.   On: 03/28/2017 14:57        Scheduled Meds: . amLODipine  5 mg Oral Daily  . apixaban  2.5 mg Oral BID  . chlorhexidine  15 mL Mouth Rinse BID  . insulin aspart  0-20 Units Subcutaneous Q4H  . mouth rinse  15 mL Mouth Rinse q12n4p   Continuous Infusions: . dextrose 125 mL/hr at 03/29/17 1916  . fluconazole (DIFLUCAN) IV Stopped (03/30/17 0340)  . potassium chloride       LOS: 2 days    Time spent: 30 minutes    Pratik Darleen Crocker, DO [facility] Pager [phone]  If 7PM-7AM, please contact night-coverage [email or web address] Password [facility] 03/30/2017, 10:22 AM

## 2017-03-31 LAB — BASIC METABOLIC PANEL
ANION GAP: 9 (ref 5–15)
BUN: 9 mg/dL (ref 6–20)
CALCIUM: 7.8 mg/dL — AB (ref 8.9–10.3)
CO2: 24 mmol/L (ref 22–32)
CREATININE: 0.78 mg/dL (ref 0.61–1.24)
Chloride: 108 mmol/L (ref 101–111)
GFR calc Af Amer: >60 mL/min (ref >60)
Glucose, Bld: 282 mg/dL — ABNORMAL HIGH (ref 65–99)
Potassium: 3.1 mmol/L — ABNORMAL LOW (ref 3.5–5.1)
Sodium: 141 mmol/L (ref 135–145)

## 2017-03-31 LAB — GLUCOSE, CAPILLARY
GLUCOSE-CAPILLARY: 173 mg/dL — AB (ref 65–99)
GLUCOSE-CAPILLARY: 149 mg/dL — AB (ref 65–99)
GLUCOSE-CAPILLARY: 89 mg/dL (ref 65–99)
Glucose-Capillary: 97 mg/dL (ref 65–99)

## 2017-03-31 LAB — CBC
HCT: 36 % — ABNORMAL LOW (ref 39.0–52.0)
Hemoglobin: 11.1 g/dL — ABNORMAL LOW (ref 13.0–17.0)
MCH: 28.8 pg (ref 26.0–34.0)
MCHC: 30.8 g/dL (ref 30.0–36.0)
MCV: 93.5 fL (ref 78.0–100.0)
PLATELETS: 353 10*3/uL (ref 150–400)
RBC: 3.85 MIL/uL — ABNORMAL LOW (ref 4.22–5.81)
RDW: 14.9 % (ref 11.5–15.5)
WBC: 7.5 10*3/uL (ref 4.0–10.5)

## 2017-03-31 MED ORDER — MAGNESIUM SULFATE 2 GM/50ML IV SOLN
2.0000 g | Freq: Once | INTRAVENOUS | Status: AC
Start: 2017-03-31 — End: 2017-03-31
  Administered 2017-03-31: 2 g via INTRAVENOUS
  Filled 2017-03-31: qty 50

## 2017-03-31 MED ORDER — SODIUM CHLORIDE 0.45 % IV SOLN
INTRAVENOUS | Status: DC
  Administered 2017-03-31 – 2017-04-02 (×3): via INTRAVENOUS

## 2017-03-31 MED ORDER — POTASSIUM CHLORIDE 10 MEQ/100ML IV SOLN
10.0000 meq | INTRAVENOUS | Status: AC
  Administered 2017-03-31 (×6): 10 meq via INTRAVENOUS
  Filled 2017-03-31 (×6): qty 100

## 2017-03-31 NOTE — Progress Notes (Signed)
Speech Language Pathology Treatment: Dysphagia  Patient Details Name: GEORG ANG MRN: [id number] DOB: 12/23/1950 Today's Date: 03/31/2017 Time: 0930-1000 SLP Time Calculation (min) (ACUTE ONLY): 30 min  Assessment / Plan / Recommendation Clinical Impression  Pt seen for ongoing diagnostic dysphagia intervention this AM. This SLP saw Pt on 03/18/17 for MBSS and his dysarthria is more severe at present. Oral care completed and dysarthria strategies reinforced with Pt (cued to over articulate, give one word at time, spell out first few letters of each word as needed, and supplement with gestures of head nod/shake, thumbs up/down).  Pt spelled out "regret" at one point and the word "past", however SLP unable to fully discern significance. Pt with grimmace for swallows, however he denies pain/discomfort. He endorses that it is difficulty for him to swallow. Pt consumed 4 oz nectar-thick orange juice via SLP provided teaspoon presentations. Pt accepted a few bites of puree. Can go ahead and advance to D1/puree and NTL, however intake will likely be insufficient to meet needs. He will need 100% feeder assist and most efficient intake will likely be NTL provided via teaspoon presentations. Consider offering Magic Cup for cold stimulus. Pt continues to be at risk for aspiration and reduced ability to take in sufficient calories orally. Recommend continued SLP intervention.   HPI HPI: Pt is a 67 y.o. male with PMH of 66yomPMH stroke with aphasia, RUE weakness, DM presented with reported altered mental status and c/o difficulty swallowing, holding liquids in mouth. Admitted for dehydration, dysphagia. Patient's speech compromised but >50% intelligible. Reports odynophagia and dysphagia, mouth pain for several days. No fever or associated symptoms noted. History is limited, no specific aggravating or alleviating factors noted. Seen in ED 1/16 for choking episode. Leukocytosis noted, but otherwise stable and  discharged. CXR showed no acute findings. Head CT showed "There is age related volume loss. There is no intracranial mass, hemorrhage, extra-axial fluid collection, or midline shift. There is extensive decreased attenuation throughout the periventricular white matter with multiple presumed infarcts throughout the centra semiovale, more severe on the right than on the left. There is decreased attenuation in both thalamus regions as well as in the genu and posterior limb of the left internal capsule and in the posterior right lentiform nucleus, stable. There is small vessel disease throughout much of the pons in the basilar perforator distribution. No acute appearing infarct is evident on this study." Pt did have a MBS on 1/7- mod/severe oral phase dysphagia and mod pharyngeal phase dysphagia that was suspected to be neurogenic due to previous strokes; recommendation was for puree/ nectar thick liquids, meds crushed, multiple dry swallows. Bedside swallow eval ordered.      SLP Plan  Continue with current plan of care       Recommendations  Diet recommendations: Nectar-thick liquid;Dysphagia 1 (puree) Liquids provided via: Teaspoon;No straw Medication Administration: Via alternative means Supervision: Full supervision/cueing for compensatory strategies;Trained caregiver to feed patient Compensations: Slow rate;Small sips/bites;Multiple dry swallows after each bite/sip(ensure clear oral cavity) Postural Changes and/or Swallow Maneuvers: Seated upright 90 degrees;Upright 30-60 min after meal                Oral Care Recommendations: Oral care QID;Oral care prior to ice chip/H20;Oral care before and after PO;Staff/trained caregiver to provide oral care Follow up Recommendations: Skilled Nursing facility;24 hour supervision/assistance Plan: Continue with current plan of care       Thank you,  Genene Churn, [place]  PORTER,DABNEY 03/31/2017, 11:57  AM

## 2017-03-31 NOTE — Progress Notes (Signed)
Foam dressings to bilat heels changed.

## 2017-03-31 NOTE — Progress Notes (Addendum)
PROGRESS NOTE    Rick Welch  WFU:[id number] DOB: 02/11/1951 DOA: 03/28/2017 PCP: Medicine, [facility]   Brief Narrative:  This is a 67 year old male with prior history of CVA and residual a aphasia, dysphagia, and right upper extremity weakness who presented with altered mentation as well as difficulty swallowing and holding liquids in his mouth.  He has been admitted for dehydration and dysphagia.  He is noted to have oral candidiasis for which he has been started on IV Diflucan with a speech therapy consultation recommending nectar thickened liquids after initial evaluation.  He was previously recommended to be on a pured diet.  He was initially started on D5 IV fluid due to hypernatremia which has resolved, but now he remains hyperglycemic for which his fluid has been adjusted to half normal saline today.  CT scan of the head did not demonstrate any acute findings.  He was noted to be less responsive and altered yesterday, but improved into the afternoon and appears to be much better this morning after better control of his blood glucose as well as repletion of his potassium.  He continues to have some ongoing hyperglycemia as well as some hypokalemia.  No acute overnight events have been noted.   Assessment & Plan:   Principal Problem:   Dehydration Active Problems:   Diabetes (HCC)   Elevated troponin   Stage 2 skin ulcer of sacral region (HCC)   Decubitus ulcer of heel, stage 1   Dehydration secondary to poor oral intake, dysphagia, odynophagia, oral candidiasis  -Continue on nectar thick as tolerated -IV fluid to half normal saline today at 50 cc/h - Appreciate further speech evaluation by tomorrow to see if diet could be advanced -Continue IV Diflucan -Holding oral meds  Hypokalemia/Hypomagnesemia -Replete with IV Potassium 60 mEq repeat today -Magnesium sulfate 2 g IV x1 today -Check Mg in a.m. -AM BMP -Tele monitoring through  today  Hyperglycemia-improving -Continue resistant sliding scale insulin -D5 water discontinued and switched to half-normal saline  Metabolic encephalopathy likely due to hyperglycemia and hypokalemia-improved -Continue current treatments and consider repeat head imaging by a.m. if this is worsened or unimproved  Elevated troponin - flat with no suspicion of ACS  Chronic indwelling foley catheter. No evidence of infeciton - continue foley  Stage 2 sacral pressure ulcer, stage I on right heel, both present on admission - wound care  DM type 2. Hold oral meds.  - SSI Increased; improving  Prior CVA with neurological sequelae -Holding Eliquis due to poor swallow capacity; transitioned to Lovenox -Poor overall prognosis; will consult palliative medicine to discuss goals of care as guardian wants to be aggressive and even consider a PEG tube which appears inappropriate; attempted Friday, but no guardian at bedside; will attempt again by Monday.    DVT prophylaxis: Lovenox Code Status: Full Family Communication: Will call guardian today (GF) Disposition Plan: Home with 24 hour care after ST evaluation   Consultants:   ST  Procedures:   None  Antimicrobials:   Diflucan 1/17->   Subjective: Patient seen and evaluated today; no acute overnight events noted. He appears more responsive and arousable today.  Nursing staff will try his nectar thickened liquids again today.  Objective: Vitals:   03/30/17 0623 03/30/17 1437 03/30/17 2005 03/31/17 0500  BP: 125/66 136/74 (!) 151/71 134/76  Pulse: 65 76 73 67  Resp: 17 16 18 18   Temp: (!) 97.5 F (36.4 C) 97.6 F (36.4 C) 98.2 F (36.8 C) 98.3 F (  36.8 C)  TempSrc: Axillary Axillary Oral Axillary  SpO2: 98% 95% 99% 97%  Weight:      Height:        Intake/Output Summary (Last 24 hours) at 03/31/2017 1116 Last data filed at 03/31/2017 0410 Gross per 24 hour  Intake 2203.33 ml  Output 1400 ml  Net 803.33 ml    Filed Weights   03/28/17 1422 03/28/17 1814  Weight: 64.4 kg (142 lb) 64.4 kg (142 lb 0 oz)    Examination:  General exam: Appears calm and comfortable ; minimally responsive Respiratory system: Clear to auscultation. Respiratory effort normal. Cardiovascular system: S1 & S2 heard, RRR. No JVD, murmurs, rubs, gallops or clicks. No pedal edema. Gastrointestinal system: Abdomen is nondistended, soft and nontender. No organomegaly or masses felt. Normal bowel sounds heard. Central nervous system: Alert and oriented. No focal neurological deficits. Extremities: Symmetric 5 x 5 power. Skin: No rashes, lesions or ulcers    Data Reviewed: I have personally reviewed following labs and imaging studies  CBC: Recent Labs  Lab 03/26/17 1133 03/28/17 1448 03/29/17 0407 03/30/17 0604 03/31/17 0638  WBC 25.0* 15.4* 12.4* 8.7 7.5  NEUTROABS 22.5* 12.3*  --   --   --   HGB 11.8* 11.0* 11.4* 10.6* 11.1*  HCT 37.5* 35.5* 38.0* 34.8* 36.0*  MCV 92.4 94.4 96.2 94.8 93.5  PLT 437* 469* 450* 411* 528   Basic Metabolic Panel: Recent Labs  Lab 03/26/17 1133 03/28/17 1448 03/28/17 1702 03/29/17 0407 03/30/17 0604 03/30/17 0748 03/31/17 0638  NA 143 155*  --  156* 139  --  141  K 3.1* 3.0*  --  3.7 2.7*  --  3.1*  CL 104 116*  --  122* 105  --  108  CO2 23 23  --  22 24  --  24  GLUCOSE 209* 174*  --  180* 395*  --  282*  BUN 15 23*  --  21* 12  --  9  CREATININE 1.48* 1.58*  --  1.32* 0.91  --  0.78  CALCIUM 8.7* 8.3*  --  8.2* 7.5*  --  7.8*  MG  --   --  1.8 1.8  --  1.5*  --    GFR: Estimated Creatinine Clearance: 80.5 mL/min (by C-G formula based on SCr of 0.78 mg/dL). Liver Function Tests: Recent Labs  Lab 03/26/17 1133 03/28/17 1448  AST 13* 11*  ALT 8* 8*  ALKPHOS 95 88  BILITOT 1.3* 1.3*  PROT 6.6 6.4*  ALBUMIN 3.0* 2.8*   Recent Labs  Lab 03/28/17 1448  LIPASE 30   No results for input(s): AMMONIA in the last 168 hours. Coagulation Profile: No results for  input(s): INR, PROTIME in the last 168 hours. Cardiac Enzymes: Recent Labs  Lab 03/28/17 1448 03/28/17 2257 03/29/17 0407 03/29/17 0934  TROPONINI 0.04* 0.03* 0.03* <0.03   BNP (last 3 results) No results for input(s): PROBNP in the last 8760 hours. HbA1C: No results for input(s): HGBA1C in the last 72 hours. CBG: Recent Labs  Lab 03/30/17 1130 03/30/17 1634 03/30/17 2005 03/30/17 2358 03/31/17 0728  GLUCAP 170* 131* 107* 147* 173*   Lipid Profile: No results for input(s): CHOL, HDL, LDLCALC, TRIG, CHOLHDL, LDLDIRECT in the last 72 hours. Thyroid Function Tests: No results for input(s): TSH, T4TOTAL, FREET4, T3FREE, THYROIDAB in the last 72 hours. Anemia Panel: No results for input(s): VITAMINB12, FOLATE, FERRITIN, TIBC, IRON, RETICCTPCT in the last 72 hours. Sepsis Labs: Recent Labs  Lab 03/28/17  1448 03/28/17 1615  LATICACIDVEN 1.2 1.1    Recent Results (from the past 240 hour(s))  Urine culture     Status: None   Collection Time: 03/28/17  2:36 PM  Result Value Ref Range Status   Specimen Description URINE, CATHETERIZED  Final   Special Requests NONE  Final   Culture   Final    NO GROWTH Performed at [facility], [place] [address] [place], [place] [address]    Report Status 03/30/2017 FINAL  Final       Radiology Studies: No results found.      Scheduled Meds: . amLODipine  5 mg Oral Daily  . chlorhexidine  15 mL Mouth Rinse BID  . enoxaparin (LOVENOX) injection  40 mg Subcutaneous Q24H  . insulin aspart  0-20 Units Subcutaneous Q4H  . mouth rinse  15 mL Mouth Rinse q12n4p   Continuous Infusions: . sodium chloride    . fluconazole (DIFLUCAN) IV Stopped (03/30/17 2218)  . magnesium sulfate 1 - 4 g bolus IVPB    . potassium chloride       LOS: 3 days    Time spent: 30 minutes    Taleigh Gero Darleen Crocker, DO [facility] Pager [phone]  If 7PM-7AM, please contact night-coverage [email or web address] Password [email or web address] 03/31/2017, 11:16 AM

## 2017-04-01 ENCOUNTER — Ambulatory Visit: Payer: BLUE CROSS/BLUE SHIELD | Admitting: Student

## 2017-04-01 ENCOUNTER — Encounter (HOSPITAL_COMMUNITY): Payer: Self-pay | Admitting: Primary Care

## 2017-04-01 DIAGNOSIS — Z515 Encounter for palliative care: Secondary | ICD-10-CM

## 2017-04-01 DIAGNOSIS — L89601 Pressure ulcer of unspecified heel, stage 1: Secondary | ICD-10-CM

## 2017-04-01 DIAGNOSIS — R627 Adult failure to thrive: Secondary | ICD-10-CM

## 2017-04-01 DIAGNOSIS — Z7189 Other specified counseling: Secondary | ICD-10-CM

## 2017-04-01 LAB — CBC
HEMATOCRIT: 33.9 % — AB (ref 39.0–52.0)
Hemoglobin: 10.5 g/dL — ABNORMAL LOW (ref 13.0–17.0)
MCH: 28.6 pg (ref 26.0–34.0)
MCHC: 31 g/dL (ref 30.0–36.0)
MCV: 92.4 fL (ref 78.0–100.0)
PLATELETS: 349 10*3/uL (ref 150–400)
RBC: 3.67 MIL/uL — ABNORMAL LOW (ref 4.22–5.81)
RDW: 14.7 % (ref 11.5–15.5)
WBC: 7.4 10*3/uL (ref 4.0–10.5)

## 2017-04-01 LAB — BASIC METABOLIC PANEL
Anion gap: 5 (ref 5–15)
BUN: 7 mg/dL (ref 6–20)
CO2: 23 mmol/L (ref 22–32)
CREATININE: 0.81 mg/dL (ref 0.61–1.24)
Calcium: 7.9 mg/dL — ABNORMAL LOW (ref 8.9–10.3)
Chloride: 112 mmol/L — ABNORMAL HIGH (ref 101–111)
GFR calc Af Amer: >60 mL/min (ref >60)
GLUCOSE: 120 mg/dL — AB (ref 65–99)
POTASSIUM: 3.9 mmol/L (ref 3.5–5.1)
SODIUM: 140 mmol/L (ref 135–145)

## 2017-04-01 LAB — GLUCOSE, CAPILLARY
GLUCOSE-CAPILLARY: 157 mg/dL — AB (ref 65–99)
GLUCOSE-CAPILLARY: 111 mg/dL — AB (ref 65–99)
GLUCOSE-CAPILLARY: 131 mg/dL — AB (ref 65–99)
Glucose-Capillary: 102 mg/dL — ABNORMAL HIGH (ref 65–99)
Glucose-Capillary: 125 mg/dL — ABNORMAL HIGH (ref 65–99)
Glucose-Capillary: 124 mg/dL — ABNORMAL HIGH (ref 65–99)

## 2017-04-01 LAB — MAGNESIUM: Magnesium: 1.7 mg/dL (ref 1.7–2.4)
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
## 2017-04-01 NOTE — Care Management (Signed)
Patient Information   SS#: [id number]  Patient Name Rick Welch, Rick Welch ([id number]) Sex Male DOB [birth year]  Room Bed  A318 A318-01  Patient Demographics   Address [place] [address] Phone [phone] (Home) E-mail Address [email or web address]  Patient Ethnicity & Race   Ethnic Group Patient Race  Not Hispanic or Latino White or Caucasian  Emergency Contact(s)   Name Relation Home Work Mobile  Hines,Trina Friend [phone]    Documents on File    Status Date Received Description  Documents for the Patient  [place] Not Received    [place] E-Signature HIPAA Notice of Privacy Received 02/09/11   [place] E-Signature HIPAA Notice of Privacy Spanish Not Received    Driver's License Received 92/42/68   Insurance Card Received 12/10/16 MEDICARE & BCBS 2018  Advance Directives/Living Will/HCPOA/POA Not Received    Financial Application Not Received    Driver's License Not Received    Insurance Card Not Received    VVS Policy for Pain - E Signature Not Received    AMB Correspondence Not Received  10/12 referral Retta Diones [facility] [place]  Insurance Card Received 09/08/13 [place] Card Not Received    HIM ROI Authorization Not Received    AMB Intake Forms/Questionnaires Not Received    AMB Correspondence Not Received  07/13 Referral Nils Pyle MD, H  Insurance Card Not Received    AMB Correspondence Not Received  11/14 Referral [facility]  Other Photo ID Not Received    Release of Information Received 12/10/16 [facility]  12/18/16 D/S [facility] CARE  Release of Information Received 01/04/17 DPR Signed 2018 [facility]BP[facility]M  AMB Provider Completed Forms Received 12/26/16 CLARIFICATION [facility]  AMB Provider Completed Forms Received 01/17/17 PROFESSIONAL COMMUNICATION [facility]  AMB HH/NH/Hospice Received 34/19/62 HH  CERTIFICATION & POC [facility]  AMB HH/NH/Hospice Received 03/11/17 ORDER [facility]  AMB HH/NH/Hospice Received 02/10/17 ORDER [facility]  AMB HH/NH/Hospice Received 02/08/17 [place] E-Signature HIPAA Notice of Privacy Signed 03/15/17   AMB HH/NH/Hospice Received 03/01/17 ORDER [facility]  Advance Directives/Living Will/HCPOA/POA Received 03/20/17   AMB HH/NH/Hospice Received 01/21/17 ORDER [facility]  AMB Correspondence (Deleted) 02/19/11 10/12 referral [place] Not Received (Deleted)    AMB Correspondence Not Received (Deleted)  [facility]  AMB Correspondence Not Received (Deleted)  06/14-11/14 OFFICE NOTES [place]  Patient Photo   Photo of Patient  Documents for the Encounter  AOB (Assignment of Insurance Benefits) Received 03/28/17 unable to obtain  E-signature AOB     MEDICARE RIGHTS Received 03/28/17 unable to obtain  E-signature Medicare Rights     Cardiac Monitoring Strip Shift Summary Received 03/28/17   Cardiac Monitoring Strip Received 03/29/17   ED Patient Billing Extract   ED PB Billing Extract  EKG Received 03/29/17   Admission Information   Attending Provider Admitting Provider Admission Type Admission Date/Time  Dhungel, Flonnie Overman, MD Samuella Cota, MD Emergency 03/28/17 1419  Discharge Date Hospital Service Auth/Cert Status Service Area   Internal Medicine Incomplete [place]  Unit Room/Bed Admission Status   AP-DEPT 300 A318/A318-01 Admission (Confirmed)   Admission   Complaint  altered mental status  Hospital Account   Name Acct ID Class Status Primary Coverage  Rick Welch, Rick Welch [id number] Inpatient Open MEDICARE -  MEDICARE PART A AND B      Guarantor Account (for Hospital Account [id number])   Name Relation to Pt Service Area Active? Acct Type  [place] Self CHSA Yes Personal/Family  Address Phone    [address] [place], [place] [address] [phone])        Coverage Information (for Hospital Account [id number])   1. [place] PART A AND B   F/O Payor/Plan Precert #  MEDICARE/MEDICARE PART A AND B   Subscriber Subscriber #  Rick Welch, Rick Welch [id number]  Address Phone  [address] [place], [place] [address]   2. BLUE CROSS BLUE SHIELD/BCBS SUPPLEMENT   F/O Payor/Plan Precert #  BLUE CROSS BLUE SHIELD/BCBS SUPPLEMENT   Subscriber Subscriber #  Rick Welch, Rick Welch [id number]  Address Phone  [address] [place], [place] [address] [phone]

## 2017-04-01 NOTE — Care Management Note (Signed)
Case Management Note  Patient Details  Name: Rick Welch MRN: [id number] Date of Birth: 10-07-1950      Admitted with dehydration secondary to dysphagia. Pt from home, lives alone, has 24/7 care and GF who is his POA/guardian ([place]). Pt has all necessary DME at home to be cared for. Palliative consult has resulted in plan for DC home with hospice services. CM has verified with Lisabeth Pick, they prefer [facility]. Referral has been sent. DC planned for tomorrow. CM will f/u with hospice tomorrow to verify DC and fax DC summary.               Expected Discharge Date:     04/02/2016             Expected Discharge Plan:  Home w Hospice Care  In-House Referral:  Hospice / Palliative Care  Discharge planning Services  CM Consult  Post Acute Care Choice:  Hospice Choice offered to:  [facility] POA / Guardian  HH Arranged:  RN Herman:  [facility]  Status of Service:  In process, will continue to follow  Sherald Barge, RN 04/01/2017, 2:14 PM
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
## 2017-04-02 DIAGNOSIS — E860 Dehydration: Principal | ICD-10-CM

## 2017-04-02 LAB — GLUCOSE, CAPILLARY
Glucose-Capillary: 136 mg/dL — ABNORMAL HIGH (ref 65–99)
Glucose-Capillary: 147 mg/dL — ABNORMAL HIGH (ref 65–99)

## 2017-04-02 MED ORDER — FLUCONAZOLE 10 MG/ML PO SUSR: 10.0000 mg/kg | Freq: Every day | ORAL | 0 refills | Status: AC

## 2017-04-02 MED ORDER — INSULIN ASPART 100 UNIT/ML ~~LOC~~ SOLN
0.0000 [IU] | Freq: Three times a day (TID) | SUBCUTANEOUS | Status: DC
  Administered 2017-04-02 (×2): 3 [IU] via SUBCUTANEOUS

## 2017-04-12 NOTE — Progress Notes (Signed)
Foam dressings to BL ankles changed as well las sacrum

## 2017-04-12 NOTE — Care Management (Signed)
[facility] [place] notified of discharge. DC summary faxed.

## 2017-04-12 NOTE — Progress Notes (Signed)
Patient to be discharged home with hospice care. Patient stated his girlfriend, Rick Welch should be notified regarding discharge plan. She is also healthcare POA according to records on file in document list. Spoke with her to notify of discharge today. She is aware he is discharge home with hospice care. Requested EMS transport. Discussed with Dr. Jamesetta Geralds since order said discharge to SNF. Stated okay to change order to discharge home with hospice care. Donavan Foil, RN

## 2017-04-12 NOTE — Progress Notes (Signed)
Patient left floor in stable condition for discharge home via EMS transport. Attempted to reach his girlfriend Clarita Crane to notify he was on the way, no answer received. Donavan Foil, RN

## 2017-04-12 NOTE — Progress Notes (Signed)
Patient to discharge home with hospice care. EMS notified of need for transport for discharge. Pt in stable condition awaiting discharge. Donavan Foil, RN

## 2017-04-12 NOTE — Discharge Summary (Signed)
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 0604 03/31/17 4235 04/01/17 0454  WBC 15.4* 12.4* 8.7 7.5 7.4  NEUTROABS 12.3*  --   --   --   --   HGB 11.0* 11.4* 10.6* 11.1* 10.5*  HCT 35.5* 38.0* 34.8* 36.0* 33.9*  MCV 94.4 96.2 94.8 93.5 92.4  PLT 469* 450* 411* 353 361   Basic Metabolic Panel: Recent Labs  Lab 03/28/17 1448 03/28/17 1702 03/29/17 0407 03/30/17 0604 03/30/17 0748 03/31/17 0638 04/01/17 0454  NA 155*  --  156* 139  --  141 140  K 3.0*  --  3.7 2.7*  --  3.1* 3.9  CL 116*  --  122* 105  --  108 112*  CO2 23  --  22 24  --  24 23  GLUCOSE 174*  --  180* 395*  --  282* 120*  BUN 23*  --  21* 12  --  9 7  CREATININE 1.58*  --  1.32* 0.91  --  0.78 0.81  CALCIUM 8.3*  --  8.2* 7.5*  --  7.8* 7.9*  MG  --  1.8 1.8  --  1.5*  --  1.7   Liver Function Tests: Recent Labs  Lab 03/28/17 1448  AST 11*  ALT 8*  ALKPHOS 88  BILITOT 1.3*  PROT 6.4*  ALBUMIN 2.8*   BNP (last 3 results) No results for input(s): BNP in the last 8760 hours. Cardiac Enzymes: Recent Labs  Lab 03/28/17 1448 03/28/17 2257 03/29/17 0407 03/29/17 0934  TROPONINI 0.04* 0.03* 0.03* <0.03   CBG: Recent Labs  Lab  04/01/17 1121 04/01/17 1634 04/01/17 2145 2017/04/10 0807 Apr 10, 2017 1156  GLUCAP 111* 124* 131* 136* 147*   Hgb A1c No results for input(s): HGBA1C in the last 72 hours. Lipid Profile No results for input(s): CHOL, HDL, LDLCALC, TRIG, CHOLHDL, LDLDIRECT in the last 72 hours. Thyroid function studies No results for input(s): TSH, T4TOTAL, T3FREE, THYROIDAB in the last 72 hours.  Invalid input(s): FREET3 Anemia work up No results for input(s): VITAMINB12, FOLATE, FERRITIN, TIBC, IRON, RETICCTPCT in the last 72 hours. Urinalysis    Component Value Date/Time   COLORURINE YELLOW 03/28/2017 1436   APPEARANCEUR CLOUDY (A) 03/28/2017 1436   LABSPEC 1.018 03/28/2017 1436   PHURINE 5.0 03/28/2017 1436   GLUCOSEU >=500 (A) 03/28/2017 1436   HGBUR NEGATIVE 03/28/2017 1436   BILIRUBINUR NEGATIVE 03/28/2017 1436   KETONESUR 20 (A) 03/28/2017 1436   PROTEINUR  30 (A) 03/28/2017 1436   NITRITE NEGATIVE 03/28/2017 1436   LEUKOCYTESUR NEGATIVE 03/28/2017 1436      Time coordinating discharge: Over 30 minutes  SIGNED: Deatra James, MD, FACP, [facility]. [facility] Pager [phone]  If 7PM-7AM, please contact night-coverage [email or web address] Password [facility] 04-28-2017, 12:52 PM

## 2017-04-12 NOTE — Progress Notes (Signed)
Patient's POA states understanding of discharge instructions

## 2017-04-12 NOTE — Care Management Important Message (Signed)
Important Message  Patient Details  Name: DEMBA NIGH MRN: [id number] Date of Birth: 24-Apr-1950   Medicare Important Message Given:  Yes    Wiktoria Hemrick, Chauncey Reading, RN 04-30-17, 9:38 AM

## 2017-04-12 NOTE — Care Management Note (Signed)
Case Management Note  Patient Details  Name: Rick Welch MRN: [id number] Date of Birth: 1950-05-28  If discussed at Long Length of Stay Meetings, dates discussed:  04/15/2017  Additional Comments:  Sherald Barge, RN 15-Apr-2017, 1:10 PM

## 2017-04-12 DEATH — deceased

## 2017-05-08 ENCOUNTER — Ambulatory Visit: Payer: BLUE CROSS/BLUE SHIELD | Admitting: Gastroenterology

## 2017-05-10 DEATH — deceased

## 2018-06-23 IMAGING — CT CT ABD-PELV W/O CM
2 of 4 series · 15 of 46 positions shown, 17 images · non-contrast
Comparison: No prior abdominal imaging.

CLINICAL DATA: Abdominal pain.  Abdominal infection.

EXAM:
CT ABDOMEN AND PELVIS WITHOUT CONTRAST
TECHNIQUE: Multidetector CT imaging of the abdomen and pelvis was performed
following the standard protocol without IV contrast.

[Series 2: axial st · axial · 0.73mm/px · z∈[+862,+1307]mm · 12 of 103 slices shown, 14 images]
[im 9/103  soft-tissue]
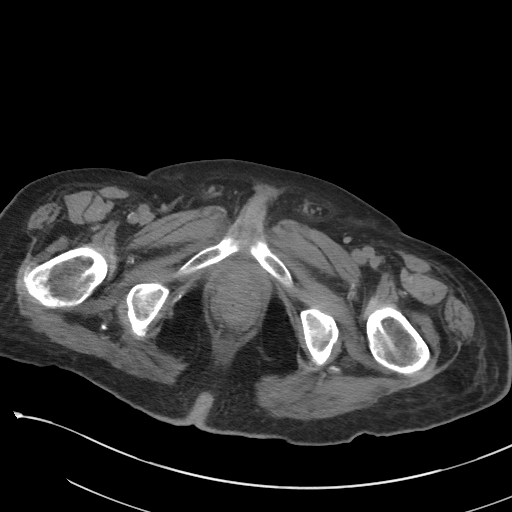
[im 9/103  bone]
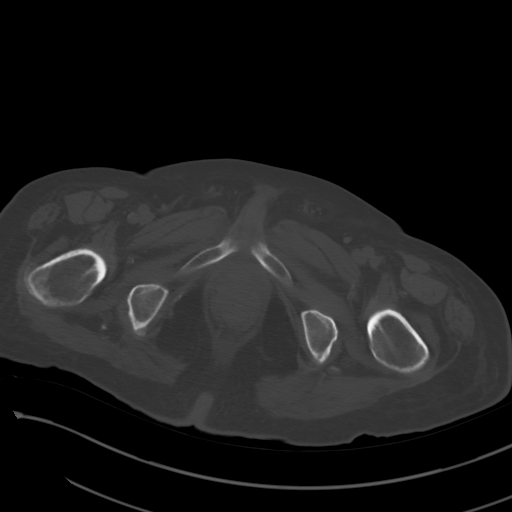
[im 17/103  soft-tissue]
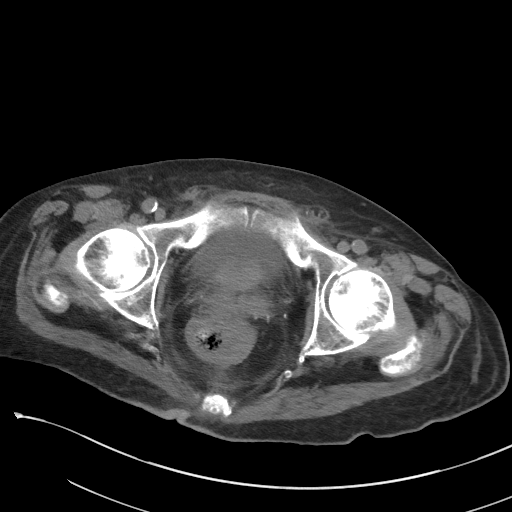
[im 25/103  soft-tissue]
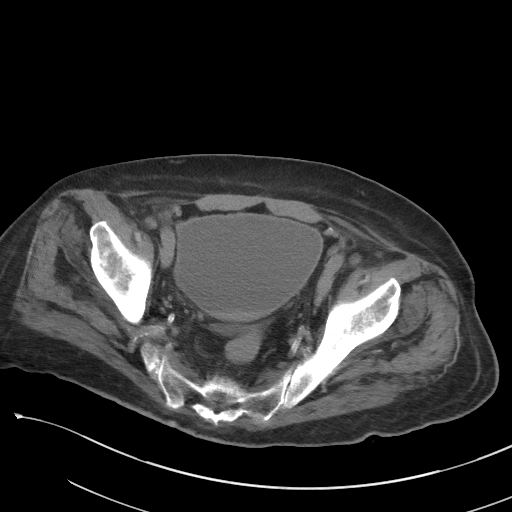
[im 33/103  soft-tissue]
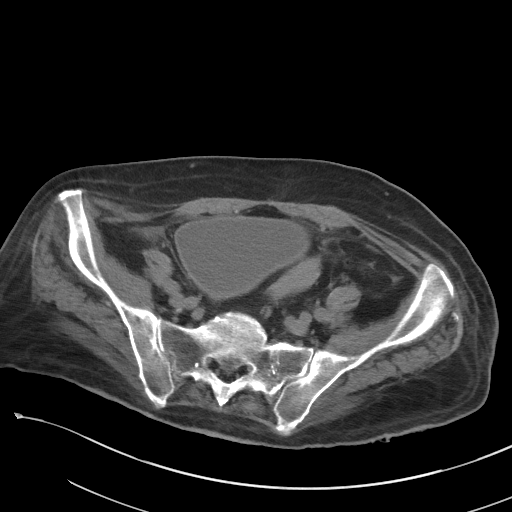
[im 41/103  soft-tissue]
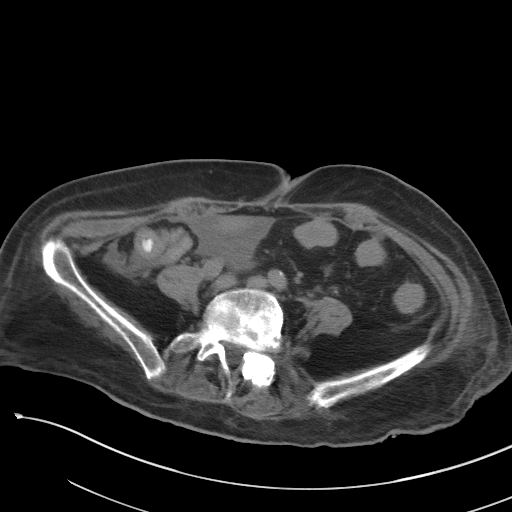
[im 49/103  soft-tissue]
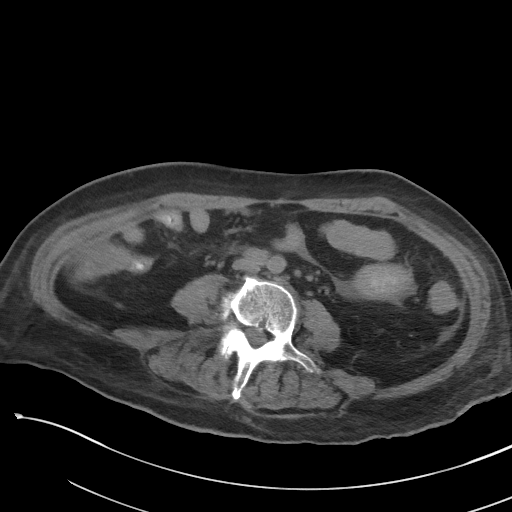
[im 58/103  soft-tissue]
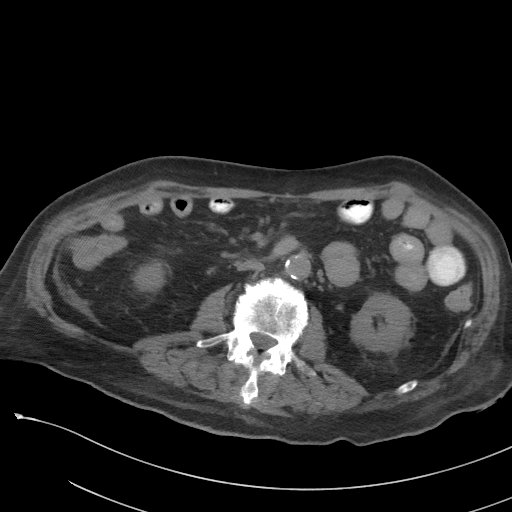
[im 66/103  soft-tissue]
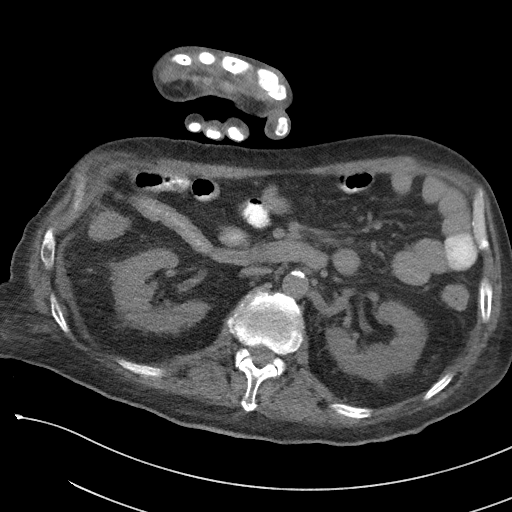
[im 74/103  soft-tissue]
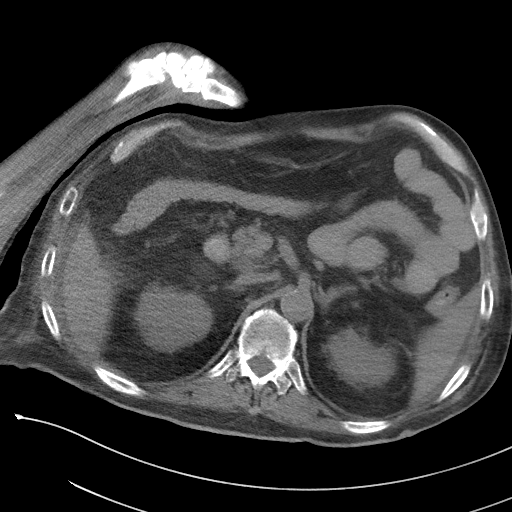
[im 74/103  bone]
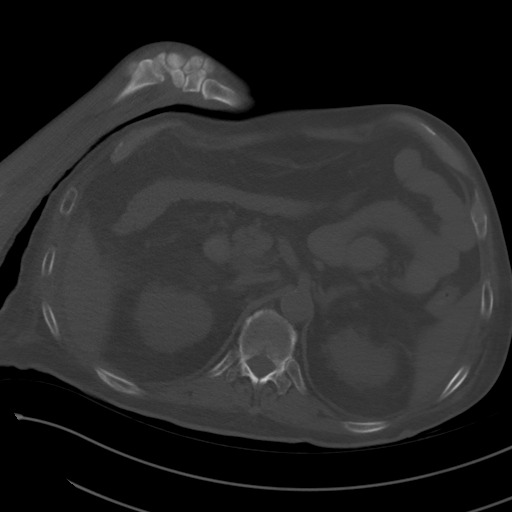
[im 82/103  soft-tissue]
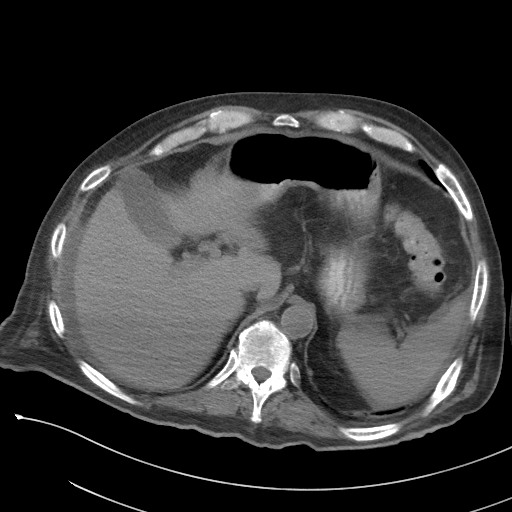
[im 90/103  soft-tissue]
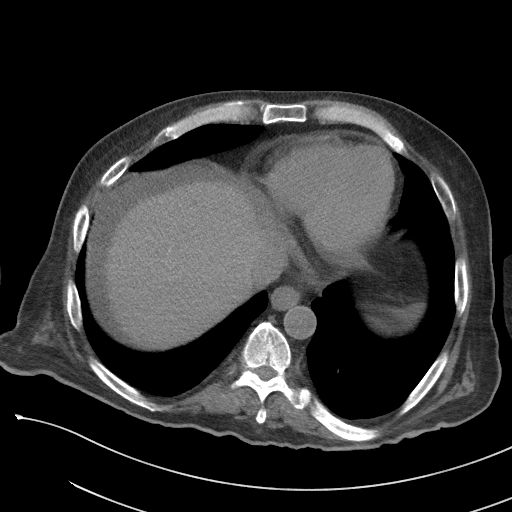
[im 98/103  soft-tissue]
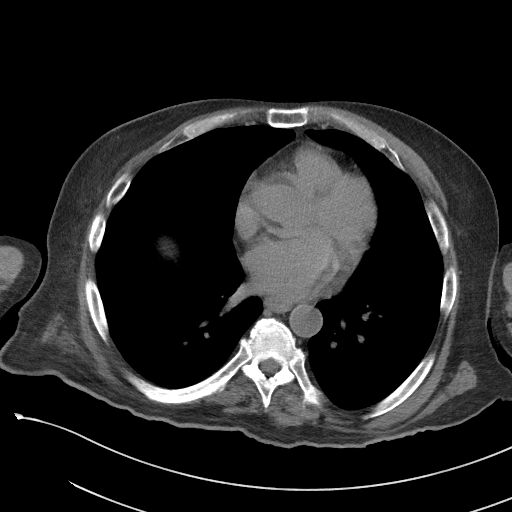

[Series 5: coronal st · coronal · 0.93mm/px · 3 of 97 slices shown]
[im 33/97  soft-tissue]
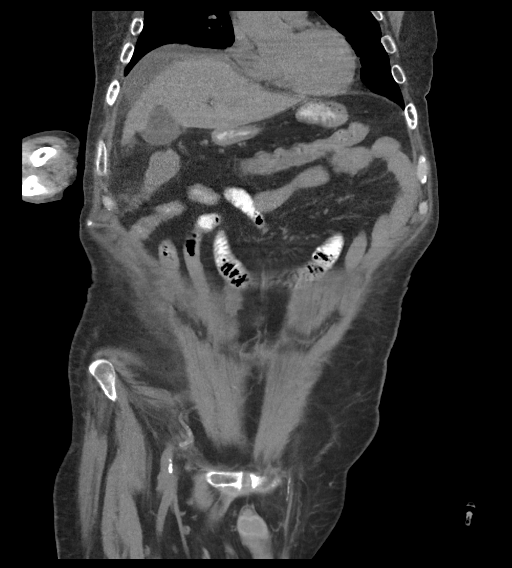
[im 43/97  soft-tissue]
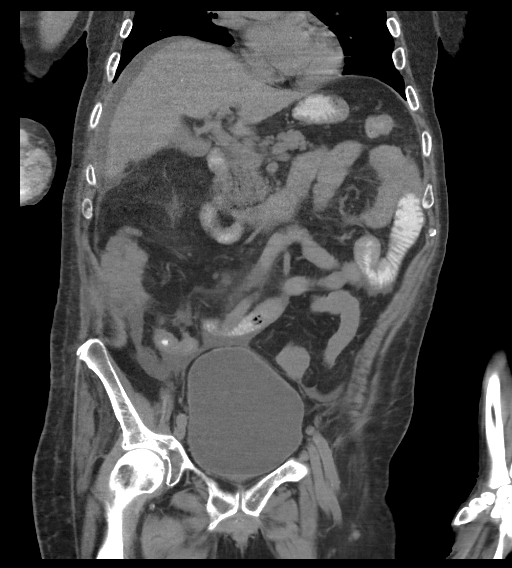
[im 54/97  soft-tissue]
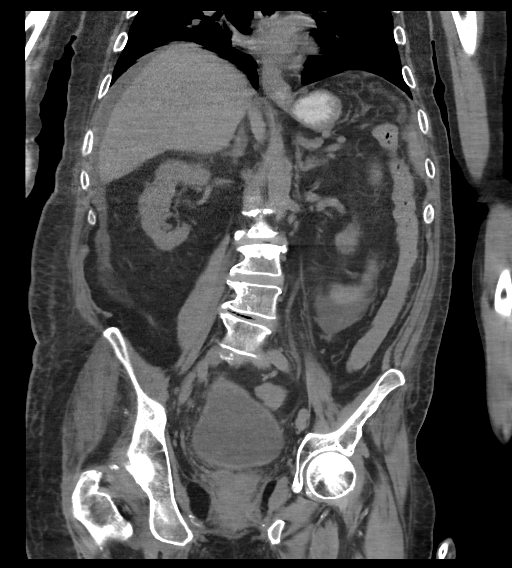

[15 of 46 positions shown; findings below may reference images not displayed]

FINDINGS: Lower chest: Linear right middle lobe paramediastinal scarring is
unchanged from chest CT 11/06/2016. Linear atelectasis in the left
lower lobe. No pleural effusion.

Hepatobiliary: No focal hepatic lesion allowing for lack contrast.
Gallbladder physiologically distended, no calcified stone. No
biliary dilatation. Small amount perihepatic ascites.

Pancreas: No ductal dilatation or inflammation.

Spleen: Normal in size without focal abnormality. Small amount
perisplenic ascites.

Adrenals/Urinary Tract: No adrenal nodule. No hydronephrosis.
Moderate bilateral perinephric stranding. No urolithiasis. Small
cortical cyst in the mid left kidney is suspected but not
well-defined without contrast. Mild bladder distention with bladder
base wall thickening versus dependent debris.

Stomach/Bowel: Minimal enteric contrast within the stomach and
scattered throughout small bowel. There is small bowel wall
thickening in the left mid abdomen with adjacent mesenteric fluid
suspicious for enteritis, best appreciated coronal image 52 series
5. This is likely short-segment, adjacent small bowel is on
opacified limiting assessment. No bowel dilatation to suggest
obstruction. Ascites in the right lower quadrant and pericolic
gutter likely obscures the appendix which is tentatively identified.
No focal periappendiceal inflammation to suggest appendicitis. The
majority of the colon is nondistended and not well evaluated.
Possible wall thickening of the descending and sigmoid colon versus
nondistention. Mild soft tissue stranding about the rectosigmoid
colon in the region of possible wall thickening. Scattered colonic
diverticulosis without diverticulitis.

Vascular/Lymphatic: Mild aortic atherosclerosis. No aneurysm.
Multiple small retroperitoneal nodes. Prominent right external iliac
node measures 11 mm short axis.

Reproductive: Prostate gland spans 4.9 cm.

Other: Small volume mesenteric and abdominopelvic ascites. No free
air. No intra-abdominal abscess.

Musculoskeletal: Degenerative disc disease and facet arthropathy
throughout the lumbar spine. Mild lumbar scoliosis. There are no
acute or suspicious osseous abnormalities.
IMPRESSION: 1. Small bowel thickening with adjacent mesenteric fluid in the left
mid abdomen consistent with enteritis, may be infectious or
inflammatory.
2. Pericolonic edema with probable colonic wall thickening involving
the rectosigmoid colon which may be related to similar small bowel
process versus proctitis.
3. Dependent debris versus small thickening at the bladder base.
Edematous kidneys bilaterally. Recommend correlation with urinalysis
to evaluate for urinary tract infection.
4. Small volume mesenteric and abdominopelvic ascites, likely
reactive.
5. Incidental diverticulosis without diverticulitis. Aortic
Atherosclerosis ([id number]).
